# Patient Record
Sex: Male | Born: 1955 | Race: White | Hispanic: No | Marital: Married | Smoking: Current some day smoker
Health system: Southern US, Community
[De-identification: ages and names within clinical notes are randomized; demographics above are authoritative.]

## PROBLEM LIST (undated history)

## (undated) DIAGNOSIS — F410 Panic disorder [episodic paroxysmal anxiety] without agoraphobia: Secondary | ICD-10-CM

## (undated) DIAGNOSIS — K219 Gastro-esophageal reflux disease without esophagitis: Secondary | ICD-10-CM

## (undated) DIAGNOSIS — T148XXA Other injury of unspecified body region, initial encounter: Secondary | ICD-10-CM

## (undated) DIAGNOSIS — I10 Essential (primary) hypertension: Secondary | ICD-10-CM

## (undated) DIAGNOSIS — R319 Hematuria, unspecified: Secondary | ICD-10-CM

## (undated) DIAGNOSIS — R0789 Other chest pain: Secondary | ICD-10-CM

## (undated) DIAGNOSIS — I4891 Unspecified atrial fibrillation: Secondary | ICD-10-CM

## (undated) HISTORY — PX: TONSILECTOMY, ADENOIDECTOMY, BILATERAL MYRINGOTOMY AND TUBES: SHX2538

## (undated) HISTORY — DX: Unspecified atrial fibrillation: I48.91

## (undated) HISTORY — DX: Other chest pain: R07.89

## (undated) HISTORY — DX: Gastro-esophageal reflux disease without esophagitis: K21.9

## (undated) HISTORY — DX: Essential (primary) hypertension: I10

---

## 1978-07-13 HISTORY — PX: NOSE SURGERY: SHX723

## 2005-06-05 ENCOUNTER — Ambulatory Visit: Payer: Self-pay | Admitting: Internal Medicine

## 2005-06-18 ENCOUNTER — Ambulatory Visit: Payer: Self-pay | Admitting: Internal Medicine

## 2006-08-17 ENCOUNTER — Ambulatory Visit: Payer: Self-pay | Admitting: Internal Medicine

## 2007-01-21 ENCOUNTER — Ambulatory Visit (HOSPITAL_COMMUNITY): Admission: RE | Admit: 2007-01-21 | Discharge: 2007-01-21 | Payer: Self-pay | Admitting: Internal Medicine

## 2007-01-21 ENCOUNTER — Ambulatory Visit: Payer: Self-pay | Admitting: Internal Medicine

## 2007-02-28 ENCOUNTER — Inpatient Hospital Stay (HOSPITAL_COMMUNITY): Admission: EM | Admit: 2007-02-28 | Discharge: 2007-03-01 | Payer: Self-pay | Admitting: Emergency Medicine

## 2007-02-28 ENCOUNTER — Ambulatory Visit: Payer: Self-pay | Admitting: Internal Medicine

## 2007-03-01 ENCOUNTER — Encounter: Payer: Self-pay | Admitting: Internal Medicine

## 2007-03-11 ENCOUNTER — Encounter: Payer: Self-pay | Admitting: Internal Medicine

## 2007-03-11 ENCOUNTER — Ambulatory Visit: Payer: Self-pay

## 2007-03-16 DIAGNOSIS — I10 Essential (primary) hypertension: Secondary | ICD-10-CM | POA: Insufficient documentation

## 2007-03-22 ENCOUNTER — Ambulatory Visit: Payer: Self-pay | Admitting: Internal Medicine

## 2007-07-13 ENCOUNTER — Telehealth: Payer: Self-pay | Admitting: Internal Medicine

## 2007-09-13 ENCOUNTER — Encounter: Payer: Self-pay | Admitting: Internal Medicine

## 2008-06-01 ENCOUNTER — Telehealth: Payer: Self-pay | Admitting: Internal Medicine

## 2008-06-08 ENCOUNTER — Telehealth: Payer: Self-pay | Admitting: Internal Medicine

## 2008-06-11 ENCOUNTER — Telehealth: Payer: Self-pay | Admitting: Internal Medicine

## 2008-06-18 ENCOUNTER — Ambulatory Visit: Payer: Self-pay | Admitting: Internal Medicine

## 2008-06-28 ENCOUNTER — Telehealth: Payer: Self-pay | Admitting: Internal Medicine

## 2008-07-13 HISTORY — PX: OTHER SURGICAL HISTORY: SHX169

## 2008-11-13 ENCOUNTER — Encounter: Payer: Self-pay | Admitting: Internal Medicine

## 2009-08-08 ENCOUNTER — Telehealth: Payer: Self-pay | Admitting: Internal Medicine

## 2009-12-23 ENCOUNTER — Telehealth: Payer: Self-pay | Admitting: Internal Medicine

## 2009-12-23 ENCOUNTER — Telehealth: Payer: Self-pay

## 2010-06-11 ENCOUNTER — Ambulatory Visit: Payer: Self-pay | Admitting: Family Medicine

## 2010-06-20 ENCOUNTER — Ambulatory Visit: Payer: Self-pay | Admitting: Internal Medicine

## 2010-06-27 ENCOUNTER — Ambulatory Visit: Payer: Self-pay | Admitting: Internal Medicine

## 2010-08-12 NOTE — Progress Notes (Signed)
Summary: refill allegra  Phone Note Refill Request Message from:  Fax from Pharmacy on December 23, 2009 12:01 PM  Refills Requested: Medication #1:  ALLEGRA 180 MG  TABS once daily as needed [facility] - 90 day   Method Requested: Fax to Local Pharmacy Initial call taken by: Duard Brady LPN,  December 23, 2009 12:02 PM  Follow-up for Phone Call        spoke with Inetta Fermo - spouse - will need to be seen - last seen 06/2008 - can call out a 30day but no more refills until senn. Verbalized understanding - will call back to let me know mail order or local pharm for rx Follow-up by: Duard Brady LPN,  December 23, 2009 12:08 PM

## 2010-08-12 NOTE — Assessment & Plan Note (Signed)
Summary: elevated bp//ccm   Vital Signs:  Patient profile:   54 year old male O2 Sat:      97 % Temp:     97.8 degrees F Pulse rate:   78 / minute BP sitting:   170 / 104  Vitals Entered By: Pura Spice, RN (June 11, 2010 4:21 PM) CC: bp issues and headaches   History of Present Illness: Here for HAs and elevated BP. He has a hx of HTN, and he has been taking his medications regularly. However he has not been seen here in about 2 years, and he had not checked his BP in that whole time. Over the past few weeks he has developed some chronic dull HAs that were unusual for him. No SOB or chest pain. He checked his BP at the pharmacy this am, and it was quite high, so he came in today.   Allergies (verified): No Known Drug Allergies  Past History:  Past Medical History: Reviewed history from 03/16/2007 and no changes required. Hypertension  Review of Systems  The patient denies anorexia, fever, weight loss, weight gain, vision loss, decreased hearing, hoarseness, chest pain, syncope, dyspnea on exertion, peripheral edema, prolonged cough, hemoptysis, abdominal pain, melena, hematochezia, severe indigestion/heartburn, hematuria, incontinence, genital sores, muscle weakness, suspicious skin lesions, transient blindness, difficulty walking, depression, unusual weight change, abnormal bleeding, enlarged lymph nodes, angioedema, breast masses, and testicular masses.    Physical Exam  General:  Well-developed,well-nourished,in no acute distress; alert,appropriate and cooperative throughout examination Head:  Normocephalic and atraumatic without obvious abnormalities. No apparent alopecia or balding. Eyes:  No corneal or conjunctival inflammation noted. EOMI. Perrla. Funduscopic exam benign, without hemorrhages, exudates or papilledema. Vision grossly normal. Ears:  External ear exam shows no significant lesions or deformities.  Otoscopic examination reveals clear canals, tympanic  membranes are intact bilaterally without bulging, retraction, inflammation or discharge. Hearing is grossly normal bilaterally. Nose:  External nasal examination shows no deformity or inflammation. Nasal mucosa are pink and moist without lesions or exudates. Mouth:  Oral mucosa and oropharynx without lesions or exudates.  Teeth in good repair. Neck:  No deformities, masses, or tenderness noted. Lungs:  Normal respiratory effort, chest expands symmetrically. Lungs are clear to auscultation, no crackles or wheezes. Heart:  Normal rate and regular rhythm. S1 and S2 normal without gallop, murmur, click, rub or other extra sounds.   Impression & Recommendations:  Problem # 1:  HYPERTENSION (ICD-401.9)  The following medications were removed from the medication list:    Altace 10 Mg Caps (Ramipril) ..... Once daily His updated medication list for this problem includes:    Losartan Potassium-hctz 100-25 Mg Tabs (Losartan potassium-hctz) ..... Once daily    Amlodipine Besylate 5 Mg Tabs (Amlodipine besylate) ..... Once daily  Complete Medication List: 1)  Allegra 180 Mg Tabs (Fexofenadine hcl) .... Once daily as needed 2)  Trazodone Hcl 150 Mg Tabs (Trazodone hcl) .Marland Kitchen.. 1 at bedtime 3)  Losartan Potassium-hctz 100-25 Mg Tabs (Losartan potassium-hctz) .... Once daily 4)  Amlodipine Besylate 5 Mg Tabs (Amlodipine besylate) .... Once daily  Patient Instructions: 1)  We will change his meds as above, and I asked him to follow up with Dr. Amador Cunas in 2-3 weeks. He will set up a cpx with labs also. Prescriptions: TRAZODONE HCL 150 MG  TABS (TRAZODONE HCL) 1 at bedtime  #30 x 2   Entered and Authorized by:   Nelwyn Salisbury MD   Signed by:   Nelwyn Salisbury MD  on 06/11/2010   Method used:   Electronically to        [facility] (retail)       [address]       [place], [place]  [address]       Ph: [phone]       Fax: [phone]   RxID:    [id number] AMLODIPINE BESYLATE 5 MG TABS (AMLODIPINE BESYLATE) once daily  #30 x 2   Entered and Authorized by:   Nelwyn Salisbury MD   Signed by:   Nelwyn Salisbury MD on 06/11/2010   Method used:   Electronically to        [facility] (retail)       [address]       [place], [place]  [address]       Ph: [phone]       Fax: [phone]   RxID:   [id number] LOSARTAN POTASSIUM-HCTZ 100-25 MG TABS (LOSARTAN POTASSIUM-HCTZ) once daily  #30 x 2   Entered and Authorized by:   Nelwyn Salisbury MD   Signed by:   Nelwyn Salisbury MD on 06/11/2010   Method used:   Electronically to        [facility] (retail)       [address]       [place], [place]  [address]       Ph: [phone]       Fax: [phone]   RxID:   [id number]    Orders Added: 1)  Est. Patient Level IV [[address]]

## 2010-08-12 NOTE — Progress Notes (Signed)
Summary: REFILL  Phone Note Refill Request Message from:  Fax from Pharmacy  Refills Requested: Medication #1:  ALTACE 10 MG  CAPS once daily   Brand Name Necessary? No [facility] FAX---[phone]  Initial call taken by: Warnell Forester,  August 08, 2009 10:14 AM    Prescriptions: ALTACE 10 MG  CAPS (RAMIPRIL) once daily  #90 x 3   Entered by:   Raechel Ache, RN   Authorized by:   Gordy Savers  MD   Signed by:   Raechel Ache, RN on 08/08/2009   Method used:   Printed then faxed to ...       [facility])       [address] [address]       [place], [place]  [address]       Ph: [phone]       Fax: [phone]   RxID:   [id number]

## 2010-08-12 NOTE — Progress Notes (Signed)
Summary: [facility]- allergra  Phone Note Call from Patient   Caller: Spouse Call For: Gordy Savers  MD Summary of Call: kim please call [facility] [place] [phone] Initial call taken by: Heron Sabins,  December 23, 2009 1:16 PM    Prescriptions: ALLEGRA 180 MG  TABS (FEXOFENADINE HCL) once daily as needed  #30 x 0   Entered by:   Duard Brady LPN   Authorized by:   Gordy Savers  MD   Signed by:   Duard Brady LPN on 45/40/9811   Method used:   Faxed to ...       [facility] (retail)       [address]       [place], [place]  [address]       Ph: [phone]       Fax: [phone]   RxID:   [id number]

## 2010-09-06 ENCOUNTER — Other Ambulatory Visit: Payer: Self-pay | Admitting: Family Medicine

## 2010-09-19 ENCOUNTER — Other Ambulatory Visit: Payer: BC Managed Care – PPO | Admitting: Internal Medicine

## 2010-09-19 DIAGNOSIS — Z0000 Encounter for general adult medical examination without abnormal findings: Secondary | ICD-10-CM

## 2010-09-19 LAB — POCT URINALYSIS DIPSTICK
Bilirubin, UA: NEGATIVE
Glucose, UA: NEGATIVE
Leukocytes, UA: NEGATIVE
Nitrite, UA: NEGATIVE
pH, UA: 6

## 2010-09-19 LAB — BASIC METABOLIC PANEL
Chloride: 99 mEq/L (ref 96–112)
GFR: 68.06 mL/min (ref >60.00)
Glucose, Bld: 101 mg/dL — ABNORMAL HIGH (ref 70–99)
Potassium: 3.9 mEq/L (ref 3.5–5.1)
Sodium: 139 mEq/L (ref 135–145)

## 2010-09-19 LAB — LIPID PANEL
LDL Cholesterol: 105 mg/dL — ABNORMAL HIGH (ref 0–99)
Total CHOL/HDL Ratio: 5
VLDL: 32.6 mg/dL (ref 0.0–40.0)

## 2010-09-19 LAB — CBC WITH DIFFERENTIAL/PLATELET
Eosinophils Relative: 2.4 % (ref 0.0–5.0)
HCT: 42.7 % (ref 39.0–52.0)
Hemoglobin: 15 g/dL (ref 13.0–17.0)
Lymphs Abs: 1.3 10*3/uL (ref 0.7–4.0)
MCV: 93.1 fl (ref 78.0–100.0)
Monocytes Absolute: 0.6 10*3/uL (ref 0.1–1.0)
Monocytes Relative: 10.2 % (ref 3.0–12.0)
Neutro Abs: 3.7 10*3/uL (ref 1.4–7.7)
WBC: 5.7 10*3/uL (ref 4.5–10.5)

## 2010-09-19 LAB — PSA: PSA: 0.55 ng/mL (ref 0.10–4.00)

## 2010-09-19 LAB — HEPATIC FUNCTION PANEL
Bilirubin, Direct: 0.1 mg/dL (ref 0.0–0.3)
Total Bilirubin: 0.7 mg/dL (ref 0.3–1.2)

## 2010-09-25 ENCOUNTER — Encounter: Payer: Self-pay | Admitting: Internal Medicine

## 2010-09-26 ENCOUNTER — Ambulatory Visit (INDEPENDENT_AMBULATORY_CARE_PROVIDER_SITE_OTHER): Payer: BC Managed Care – PPO | Admitting: Internal Medicine

## 2010-09-26 ENCOUNTER — Encounter: Payer: Self-pay | Admitting: Internal Medicine

## 2010-09-26 VITALS — BP 110/78 | HR 88 | Temp 98.6°F | Resp 18 | Ht 78.0 in | Wt 258.0 lb

## 2010-09-26 DIAGNOSIS — I10 Essential (primary) hypertension: Secondary | ICD-10-CM

## 2010-09-26 MED ORDER — AMLODIPINE BESYLATE 5 MG PO TABS: 5.0000 mg | ORAL_TABLET | Freq: Every day | ORAL | Status: DC

## 2010-09-26 MED ORDER — LOSARTAN POTASSIUM-HCTZ 100-25 MG PO TABS: 1.0000 | ORAL_TABLET | Freq: Every day | ORAL | Status: DC

## 2010-09-26 NOTE — Progress Notes (Signed)
  Subjective:    Patient ID: Thomas Baldwin, male    DOB: Jan 08, 1956, 55 y.o.   MRN: [id number]  HPI   55 year old patient who is seen today for a wellness exam. He has a history of treated hypertension and does quite well. No concerns or complaints today. He does monitor home blood pressure readings with nice results. He did have a colonoscopy approximately 3 years ago    Review of Systems  Constitutional: Negative for fever, chills, activity change, appetite change and fatigue.  HENT: Negative for hearing loss, ear pain, congestion, rhinorrhea, sneezing, mouth sores, trouble swallowing, neck pain, neck stiffness, dental problem, voice change, sinus pressure and tinnitus.   Eyes: Negative for photophobia, pain, redness and visual disturbance.  Respiratory: Negative for apnea, cough, choking, chest tightness, shortness of breath and wheezing.   Cardiovascular: Negative for chest pain, palpitations and leg swelling.  Gastrointestinal: Negative for nausea, vomiting, abdominal pain, diarrhea, constipation, blood in stool, abdominal distention, anal bleeding and rectal pain.  Genitourinary: Negative for dysuria, urgency, frequency, hematuria, flank pain, decreased urine volume, discharge, penile swelling, scrotal swelling, difficulty urinating, genital sores and testicular pain.  Musculoskeletal: Negative for myalgias, back pain, joint swelling, arthralgias and gait problem.  Skin: Negative for color change, rash and wound.  Neurological: Negative for dizziness, tremors, seizures, syncope, facial asymmetry, speech difficulty, weakness, light-headedness, numbness and headaches.  Hematological: Negative for adenopathy. Does not bruise/bleed easily.  Psychiatric/Behavioral: Negative for suicidal ideas, hallucinations, behavioral problems, confusion, sleep disturbance, self-injury, dysphoric mood, decreased concentration and agitation. The patient is not nervous/anxious.        Objective:   Physical  Exam  Constitutional: He appears well-developed and well-nourished.  HENT:  Head: Normocephalic and atraumatic.  Right Ear: External ear normal.  Left Ear: External ear normal.  Nose: Nose normal.  Mouth/Throat: Oropharynx is clear and moist.  Eyes: Conjunctivae and EOM are normal. Pupils are equal, round, and reactive to light. No scleral icterus.  Neck: Normal range of motion. Neck supple. No JVD present. No thyromegaly present.  Cardiovascular: Regular rhythm, normal heart sounds and intact distal pulses.  Exam reveals no gallop and no friction rub.   No murmur heard. Pulmonary/Chest: Effort normal and breath sounds normal. He exhibits no tenderness.  Abdominal: Soft. Bowel sounds are normal. He exhibits no distension and no mass. There is no tenderness.  Genitourinary: Prostate normal and penis normal.  Musculoskeletal: Normal range of motion. He exhibits no edema and no tenderness.  Lymphadenopathy:    He has no cervical adenopathy.  Neurological: He is alert. He has normal reflexes. No cranial nerve deficit. Coordination normal.  Skin: Skin is warm and dry. No rash noted.  Psychiatric: He has a normal mood and affect. His behavior is normal.          Assessment & Plan:   preventive health examination.  Hypertension well controlled

## 2010-09-26 NOTE — Patient Instructions (Signed)
It is important that you exercise regularly, at least 20 minutes 3 to 4 times per week.  If you develop chest pain or shortness of breath seek  medical attention.  Please check your blood pressure on a regular basis.  If it is consistently greater than 150/90, please make an office appointment.  Return in one year for follow-up\ 

## 2010-10-16 ENCOUNTER — Other Ambulatory Visit: Payer: Self-pay | Admitting: *Deleted

## 2010-10-16 MED ORDER — TRAZODONE HCL 150 MG PO TABS: 150.0000 mg | ORAL_TABLET | Freq: Every day | ORAL | Status: DC

## 2010-11-25 NOTE — Op Note (Signed)
NAMEMAJD, TISSUE                 ACCOUNT NO.:  [id number]   MEDICAL RECORD NO.:  [id number]          PATIENT TYPE:  AMB   LOCATION:  DAY                           FACILITY:  [facility]   PHYSICIAN:  R. Roetta Sessions, M.D. DATE OF BIRTH:  03/17/1956   DATE OF PROCEDURE:  01/21/2007  DATE OF DISCHARGE:                               OPERATIVE REPORT   PROCEDURE:  Screening colonoscopy.   INDICATIONS FOR PROCEDURE:  Patient is 55 year old Caucasian male with  no lower GI tract symptoms sent over courtesy Dr. Amador Cunas, [facility] [place] for colorectal cancer screening via  colonoscopy.  Mr. Brining has never his lower GI tract imaged.  There was  no family history of colorectal neoplasia.  Colonoscopy is now being  done as a screening maneuver.  This approach has discussed with the  patient at length.  Potential risks, benefits and alternatives have been  reviewed, questions answered, he is agreeable.  Please see documentation  in the medical record.   PROCEDURE NOTE:  O2 saturation, blood pressure, pulse respirations were  monitored throughout the entire procedure.  Conscious sedation Versed 3  mg IV Demerol 75 mg IV in divided doses.   INSTRUMENT:  Pentax video chip system.   FINDINGS:  Digital rectal exam revealed no abnormalities.   ENDOSCOPIC FINDINGS:  The prep was adequate.  Colon:  Colonic mucosa was surveyed from rectosigmoid junction to the  left, transverse, right colon to the appendiceal orifice, ileocecal  valve and cecum.  These structures well seen and photographed for the  record.  From this level scope was slowly cautiously withdrawn.  All  previously mentioned mucosal surfaces were again seen.  The colonic  mucosa appeared normal.  Scope was pulled down in the rectum.  Thorough  examination of rectal mucosa including retroflex view of the anal verge  demonstrated no abnormalities as well.  The patient tolerated the  procedure well was reacted in  endoscopy.   IMPRESSION:  1. Normal rectum.  2. Normal colon.   RECOMMENDATIONS:  Consider repeat screening colonoscopy 10 years.      Jonathon Bellows, M.D.  Electronically Signed     RMR/MEDQ  D:  01/21/2007  T:  01/22/2007  Job:  [id number]   cc:   Gordy Savers, MD  [address] [place]  [place] [address]

## 2010-11-25 NOTE — Discharge Summary (Signed)
Thomas Baldwin, BAYLOCK                 ACCOUNT NO.:  [id number]   MEDICAL RECORD NO.:  [id number]          PATIENT TYPE:  INP   LOCATION:  3701                         FACILITY:  [facility]   PHYSICIAN:  Valerie A. Felicity Coyer, MDDATE OF BIRTH:  16-Nov-1955   DATE OF ADMISSION:  02/28/2007  DATE OF DISCHARGE:  03/01/2007                               DISCHARGE SUMMARY   DISCHARGE DIAGNOSES:  1. Atypical chest pain  2. History of hypertension.  3. Question of gastroesophageal reflux disease.   HISTORY OF PRESENT ILLNESS:  Thomas Baldwin is a 55 year old male who was  admitted on February 28, 2007, with Chief Complaint of substernal chest  pain.  He has a known history of hypertension. Chest pain began during a  phone conversation at work.  It was noted to be midsternal with chest  heaviness, felt like gas but worse than ever before. Symptoms improved  with nitroglycerin.  He was admitted, and cardiac enzymes were cycled.  Cardiac enzymes remained normal. Fasting lipid profile noted a total  cholesterol of 126 and an LDL of 64. HDL was low with a value of 24.  Chest x-ray performed on admission showed bibasilar atelectasis.  A D-  dimer was normal with a value of less than 0.22.   At this time we have asked [facility] to arrange an outpatient  stress test this week.  Plan to continue empiric Protonix and discharge  the patient to home.  He is instructed, however, to return to the ER  should he develop recurrent chest pain or shortness of breath.   DISCHARGE MEDICATIONS:  1. Altace 10 mg p.o. daily.  2. Protonix 40 mg daily.  3. Aspirin 325 mg p.o. daily.  4. Allegra 60 mg p.o. daily as needed.  5. Trazodone 25 mg p.o. once daily as needed.   DISPOSITION:  Plan to discharge the patient to home.   PERTINENT LABORATORY DATA:  At time of discharge, cardiac enzymes  negative x4.   FOLLOWUP:  The patient is scheduled to follow up with Dr. Eleonore Chiquito on September 9 at 3:45  p.m.      Thomas Craze, NP      Raenette Rover. Felicity Coyer, MD  Electronically Signed    MO/MEDQ  D:  03/01/2007  T:  03/01/2007  Job:  [id number]   cc:   Gordy Savers, MD

## 2011-04-24 LAB — CARDIAC PANEL(CRET KIN+CKTOT+MB+TROPI)
CK, MB: 1.2
Relative Index: 1.3
Relative Index: INVALID
Total CK: 117
Total CK: 95
Troponin I: 0.02

## 2011-04-24 LAB — APTT: aPTT: 29

## 2011-04-24 LAB — CBC
Hemoglobin: 14.5
RBC: 4.58
RDW: 12.3
WBC: 5.6

## 2011-04-24 LAB — I-STAT 8, (EC8 V) (CONVERTED LAB)
Bicarbonate: 28.5 — ABNORMAL HIGH
Glucose, Bld: 111 — ABNORMAL HIGH
Sodium: 139
TCO2: 30
pH, Ven: 7.365 — ABNORMAL HIGH

## 2011-04-24 LAB — DIFFERENTIAL
Basophils Absolute: 0
Lymphocytes Relative: 16
Lymphs Abs: 0.9
Monocytes Absolute: 0.6
Neutro Abs: 3.8

## 2011-04-24 LAB — POCT CARDIAC MARKERS
CKMB, poc: <1 — ABNORMAL LOW
Myoglobin, poc: 77.3

## 2011-04-24 LAB — POCT I-STAT CREATININE: Operator id: 279831

## 2011-04-24 LAB — CK TOTAL AND CKMB (NOT AT ARMC)
CK, MB: 2
Total CK: 154

## 2011-04-24 LAB — LIPID PANEL: Cholesterol: 126

## 2011-05-20 ENCOUNTER — Other Ambulatory Visit: Payer: Self-pay | Admitting: Internal Medicine

## 2011-11-25 ENCOUNTER — Other Ambulatory Visit: Payer: Self-pay | Admitting: Internal Medicine

## 2011-12-29 ENCOUNTER — Other Ambulatory Visit (INDEPENDENT_AMBULATORY_CARE_PROVIDER_SITE_OTHER): Payer: BC Managed Care – PPO

## 2011-12-29 DIAGNOSIS — Z0000 Encounter for general adult medical examination without abnormal findings: Secondary | ICD-10-CM

## 2011-12-29 LAB — HEPATIC FUNCTION PANEL
AST: 25 U/L (ref 0–37)
Albumin: 4.3 g/dL (ref 3.5–5.2)
Alkaline Phosphatase: 51 U/L (ref 39–117)
Total Protein: 7.4 g/dL (ref 6.0–8.3)

## 2011-12-29 LAB — LIPID PANEL
Cholesterol: 162 mg/dL (ref 0–200)
HDL: 41.5 mg/dL (ref >39.00)
VLDL: 22.6 mg/dL (ref 0.0–40.0)

## 2011-12-29 LAB — CBC WITH DIFFERENTIAL/PLATELET
Basophils Absolute: 0 10*3/uL (ref 0.0–0.1)
Eosinophils Relative: 2 % (ref 0.0–5.0)
Hemoglobin: 14.8 g/dL (ref 13.0–17.0)
Lymphocytes Relative: 18.7 % (ref 12.0–46.0)
Monocytes Relative: 10.8 % (ref 3.0–12.0)
Neutro Abs: 3.6 10*3/uL (ref 1.4–7.7)
Platelets: 179 10*3/uL (ref 150.0–400.0)
RDW: 12.8 % (ref 11.5–14.6)
WBC: 5.3 10*3/uL (ref 4.5–10.5)

## 2011-12-29 LAB — BASIC METABOLIC PANEL
Calcium: 9.2 mg/dL (ref 8.4–10.5)
GFR: 66.45 mL/min (ref >60.00)
Glucose, Bld: 117 mg/dL — ABNORMAL HIGH (ref 70–99)
Sodium: 143 mEq/L (ref 135–145)

## 2012-01-08 ENCOUNTER — Encounter: Payer: Self-pay | Admitting: Internal Medicine

## 2012-01-08 ENCOUNTER — Ambulatory Visit (INDEPENDENT_AMBULATORY_CARE_PROVIDER_SITE_OTHER): Payer: BC Managed Care – PPO | Admitting: Internal Medicine

## 2012-01-08 VITALS — BP 130/80 | Temp 97.5°F | Ht 77.45 in | Wt 243.0 lb

## 2012-01-08 DIAGNOSIS — Z0000 Encounter for general adult medical examination without abnormal findings: Secondary | ICD-10-CM

## 2012-01-08 DIAGNOSIS — I4891 Unspecified atrial fibrillation: Secondary | ICD-10-CM

## 2012-01-08 DIAGNOSIS — I10 Essential (primary) hypertension: Secondary | ICD-10-CM

## 2012-01-08 MED ORDER — VERAPAMIL HCL ER 240 MG PO CP24: 240.0000 mg | ORAL_CAPSULE | Freq: Every day | ORAL | Status: DC

## 2012-01-08 MED ORDER — DABIGATRAN ETEXILATE MESYLATE 150 MG PO CAPS: 150.0000 mg | ORAL_CAPSULE | Freq: Two times a day (BID) | ORAL | Status: DC

## 2012-01-08 NOTE — Patient Instructions (Addendum)
Limit your sodium (Salt) intake  Cardiology followup as discussed  Atrial Fibrillation Your caregiver has diagnosed you with atrial fibrillation (AFib). The heart normally beats very regularly; AFib is a type of irregular heartbeat. The heart rate may be faster or slower than normal. This can prevent your heart from pumping as well as it should. AFib can be constant (chronic) or intermittent (paroxysmal). CAUSES   Atrial fibrillation may be caused by:  Heart disease, including heart attack, coronary artery disease, heart failure, diseases of the heart valves, and others.   Blood clot in the lungs (pulmonary embolism).   Pneumonia or other infections.   Chronic lung disease.   Thyroid disease.   Toxins. These include alcohol, some medications (such as decongestant medications or diet pills), and caffeine.  In some people, no cause for AFib can be found. This is referred to as Lone Atrial Fibrillation. SYMPTOMS    Palpitations or a fluttering in your chest.   A vague sense of chest discomfort.   Shortness of breath.   Sudden onset of lightheadedness or weakness.  Sometimes, the first sign of AFib can be a complication of the condition. This could be a stroke or heart failure. DIAGNOSIS   Your description of your condition may make your caregiver suspicious of atrial fibrillation. Your caregiver will examine your pulse to determine if fibrillation is present. An EKG (electrocardiogram) will confirm the diagnosis. Further testing may help determine what caused you to have atrial fibrillation. This may include chest x-ray, echocardiogram, blood tests, or CT scans. PREVENTION   If you have previously had atrial fibrillation, your caregiver may advise you to avoid substances known to cause the condition (such as stimulant medications, and possibly caffeine or alcohol). You may be advised to use medications to prevent recurrence. Proper treatment of any underlying condition is important to  help prevent recurrence. PROGNOSIS   Atrial fibrillation does tend to become a chronic condition over time. It can cause significant complications (see below). Atrial fibrillation is not usually immediately life-threatening, but it can shorten your life expectancy. This seems to be worse in women. If you have lone atrial fibrillation and are under 61 years old, the risk of complications is very low, and life expectancy is not shortened. RISKS AND COMPLICATIONS   Complications of atrial fibrillation can include stroke, chest pain, and heart failure. Your caregiver will recommend treatments for the atrial fibrillation, as well as for any underlying conditions, to help minimize risk of complications. TREATMENT   Treatment for AFib is divided into several categories:  Treatment of any underlying condition.   Converting you out of AFib into a regular (sinus) rhythm.   Controlling rapid heart rate.   Prevention of blood clots and stroke.  Medications and procedures are available to convert your atrial fibrillation to sinus rhythm. However, recent studies have shown that this may not offer you any advantage, and cardiac experts are continuing research and debate on this topic. More important is controlling your rapid heartbeat. The rapid heartbeat causes more symptoms, and places strain on your heart. Your caregiver will advise you on the use of medications that can control your heart rate. Atrial fibrillation is a strong stroke risk. You can lessen this risk by taking blood thinning medications such as Coumadin (warfarin), or sometimes aspirin. These medications need close monitoring by your caregiver. Over-medication can cause bleeding. Too little medication may not protect against stroke. HOME CARE INSTRUCTIONS    If your caregiver prescribed medicine to make your heartbeat  more normally, take as directed.   If blood thinners were prescribed by your caregiver, take EXACTLY as directed.   Perform  blood tests EXACTLY as directed.   Quit smoking. Smoking increases your cardiac and lung (pulmonary) risks.   DO NOT drink alcohol.   DO NOT drink caffeinated drinks (e.g. coffee, soda, chocolate, and leaf teas). You may drink decaffeinated coffee, soda or tea.   If you are overweight, you should choose a reduced calorie diet to lose weight. Please see a registered dietitian if you need more information about healthy weight loss. DO NOT USE DIET PILLS as they may aggravate heart problems.   If you have other heart problems that are causing AFib, you may need to eat a low salt, fat, and cholesterol diet. Your caregiver will tell you if this is necessary.   Exercise every day to improve your physical fitness. Stay active unless advised otherwise.   If your caregiver has given you a follow-up appointment, it is very important to keep that appointment. Not keeping the appointment could result in heart failure or stroke. If there is any problem keeping the appointment, you must call back to this facility for assistance.  SEEK MEDICAL CARE IF:  You notice a change in the rate, rhythm or strength of your heartbeat.   You develop an infection or any other change in your overall health status.  SEEK IMMEDIATE MEDICAL CARE IF:    You develop chest pain, abdominal pain, sweating, weakness or feel sick to your stomach (nausea).   You develop shortness of breath.   You develop swollen feet and ankles.   You develop dizziness, numbness, or weakness of your face or limbs, or any change in vision or speech.  MAKE SURE YOU:    Understand these instructions.   Will watch your condition.   Will get help right away if you are not doing well or get worse.  Document Released: 06/29/2005 Document Revised: 06/18/2011 Document Reviewed: 02/01/2008 [facility] Patient Information 2012 [place], [place].

## 2012-01-08 NOTE — Progress Notes (Signed)
Subjective:    Patient ID: Thomas Baldwin, male    DOB: 05-29-1956, 56 y.o.   MRN: [id number]  HPI 56 year old patient who is seen today for a wellness exam. He has an approximate seven-year history of treated hypertension and does quite well. No concerns or complaints today. He was hospitalized in 2007 for chest pain; he subsequently had a nuclear medicine stress test that was unremarkable. EKG today reveals atrial fibrillation with rapid ventricular response. He is asymptomatic and denies any palpitations  Past medical history is unremarkable except for hypertension. No hospitalizations except for the admission in 2007 for chest pain. Did have a screening colonoscopy 4 years ago Social history nonsmoker smokes a few cigars annually married one 88 year old daughter Family history father age 41 history of diabetes coronary artery disease status post pacemaker has cancer unclear type. Mother died of complications of lupus in her 49s. One brother with sarcoidosis. One sister is well    Review of Systems  Constitutional: Negative for fever, chills, activity change, appetite change and fatigue.  HENT: Negative for hearing loss, ear pain, congestion, rhinorrhea, sneezing, mouth sores, trouble swallowing, neck pain, neck stiffness, dental problem, voice change, sinus pressure and tinnitus.   Eyes: Negative for photophobia, pain, redness and visual disturbance.  Respiratory: Negative for apnea, cough, choking, chest tightness, shortness of breath and wheezing.   Cardiovascular: Negative for chest pain, palpitations and leg swelling.  Gastrointestinal: Negative for nausea, vomiting, abdominal pain, diarrhea, constipation, blood in stool, abdominal distention, anal bleeding and rectal pain.  Genitourinary: Negative for dysuria, urgency, frequency, hematuria, flank pain, decreased urine volume, discharge, penile swelling, scrotal swelling, difficulty urinating, genital sores and testicular pain.    Musculoskeletal: Negative for myalgias, back pain, joint swelling, arthralgias and gait problem.  Skin: Negative for color change, rash and wound.  Neurological: Negative for dizziness, tremors, seizures, syncope, facial asymmetry, speech difficulty, weakness, light-headedness, numbness and headaches.  Hematological: Negative for adenopathy. Does not bruise/bleed easily.  Psychiatric/Behavioral: Negative for suicidal ideas, hallucinations, behavioral problems, confusion, disturbed wake/sleep cycle, self-injury, dysphoric mood, decreased concentration and agitation. The patient is not nervous/anxious.        Objective:   Physical Exam  Constitutional: He appears well-developed and well-nourished.  HENT:  Head: Normocephalic and atraumatic.  Right Ear: External ear normal.  Left Ear: External ear normal.  Nose: Nose normal.  Mouth/Throat: Oropharynx is clear and moist.  Eyes: Conjunctivae and EOM are normal. Pupils are equal, round, and reactive to light. No scleral icterus.  Neck: Normal range of motion. Neck supple. No JVD present. No thyromegaly present.  Cardiovascular: Normal heart sounds and intact distal pulses.  Exam reveals no gallop and no friction rub.   No murmur heard.      Irregular rhythm with a rate of 120  Pulmonary/Chest: Effort normal and breath sounds normal. He exhibits no tenderness.  Abdominal: Soft. Bowel sounds are normal. He exhibits no distension and no mass. There is no tenderness.  Genitourinary: Prostate normal and penis normal.  Musculoskeletal: Normal range of motion. He exhibits no edema and no tenderness.  Lymphadenopathy:    He has no cervical adenopathy.  Neurological: He is alert. He has normal reflexes. No cranial nerve deficit. Coordination normal.  Skin: Skin is warm and dry. No rash noted.  Psychiatric: He has a normal mood and affect. His behavior is normal.          Assessment & Plan:   Preventive health examination Atrial   fibrillation with rapid ventricular response.  We'll discontinue amlodipine and placed on verapamil for rate control. We'll place on Pradaxa  150 mg twice daily. We'll schedule a 2-D echocardiogram and a cardiology appointment. Hypertension well controlled

## 2012-01-11 ENCOUNTER — Other Ambulatory Visit: Payer: Self-pay | Admitting: Internal Medicine

## 2012-01-11 DIAGNOSIS — I10 Essential (primary) hypertension: Secondary | ICD-10-CM

## 2012-01-11 NOTE — Telephone Encounter (Signed)
Caller: Thomas Baldwin/Patient; PCP: Eleonore Chiquito; CB#: [phone]; ; ; Call regarding RX not at Pharmacy; Pt was seen on 6-28, PT was under the impression the Losartan-HCTZ and Trazodone would be refilled.  Pt uses [facility] [place], Pt needs 3 mths supply.

## 2012-01-12 MED ORDER — TRAZODONE HCL 150 MG PO TABS: 150.0000 mg | ORAL_TABLET | Freq: Every day | ORAL | Status: DC

## 2012-01-12 MED ORDER — LOSARTAN POTASSIUM-HCTZ 100-25 MG PO TABS: 1.0000 | ORAL_TABLET | Freq: Every day | ORAL | Status: DC

## 2012-01-12 NOTE — Telephone Encounter (Signed)
Trazodone faxed - losartan escribed

## 2012-01-13 ENCOUNTER — Ambulatory Visit (HOSPITAL_COMMUNITY): Payer: BC Managed Care – PPO | Attending: Cardiology | Admitting: Radiology

## 2012-01-13 DIAGNOSIS — I10 Essential (primary) hypertension: Secondary | ICD-10-CM | POA: Insufficient documentation

## 2012-01-13 DIAGNOSIS — I517 Cardiomegaly: Secondary | ICD-10-CM | POA: Insufficient documentation

## 2012-01-13 DIAGNOSIS — I4891 Unspecified atrial fibrillation: Secondary | ICD-10-CM

## 2012-01-13 NOTE — Progress Notes (Signed)
Echocardiogram performed.  

## 2012-01-15 ENCOUNTER — Ambulatory Visit: Payer: BC Managed Care – PPO | Admitting: Internal Medicine

## 2012-01-15 NOTE — Progress Notes (Signed)
Quick Note:  Spoke with pt- informed test normal - keep appt with cardiologist next week ______

## 2012-01-19 ENCOUNTER — Encounter: Payer: Self-pay | Admitting: *Deleted

## 2012-01-20 ENCOUNTER — Encounter: Payer: Self-pay | Admitting: Cardiovascular Disease

## 2012-01-20 ENCOUNTER — Ambulatory Visit (INDEPENDENT_AMBULATORY_CARE_PROVIDER_SITE_OTHER): Payer: BC Managed Care – PPO | Admitting: Cardiovascular Disease

## 2012-01-20 VITALS — BP 143/90 | HR 68 | Ht 77.0 in | Wt 243.8 lb

## 2012-01-20 DIAGNOSIS — I4891 Unspecified atrial fibrillation: Secondary | ICD-10-CM

## 2012-01-20 MED ORDER — METOPROLOL TARTRATE 50 MG PO TABS: 50.0000 mg | ORAL_TABLET | Freq: Two times a day (BID) | ORAL | Status: DC

## 2012-01-20 NOTE — Progress Notes (Signed)
    Berniece Andreas Date of Birth  01-28-1956       [facility] [address], [address]  [address], [address] [place], [place]  [address]   [place], [place]  [address] [phone]     [phone]   Fax  [phone]    Fax [phone]  Problem List: 1. Atrial fibrillation 2. Hypertension  History of Present Illness:  Lynden is a 56 yo who was found to have A-Fib at his regular physical last week with Dr Rogelia Boga, MD . His amlodipine was changed to verapamil and he was started on Pradaxa.  He had an echo that revealed LV EF of around 50%.  Lower limits of normal. + diastolic dysfunction.  He was in sinus rhythm during the echo.  He feels quite well now. He wonders if he really needs to stay on his new medications versus going back to his previous medications.   Current Outpatient Prescriptions on File Prior to Visit  Medication Sig Dispense Refill  . dabigatran (PRADAXA) 150 MG CAPS Take 1 capsule (150 mg total) by mouth every 12 (twelve) hours.  60 capsule  4  . fexofenadine (ALLEGRA) 180 MG tablet Take 180 mg by mouth daily.        Marland Kitchen losartan-hydrochlorothiazide (HYZAAR) 100-25 MG per tablet Take 1 tablet by mouth daily.  90 tablet  6  . verapamil (VERELAN PM) 240 MG 24 hr capsule Take 1 capsule (240 mg total) by mouth at bedtime.  90 capsule  0  . traZODone (DESYREL) 150 MG tablet Take 1 tablet (150 mg total) by mouth at bedtime.  90 tablet  1    No Known Allergies  Past Medical History  Diagnosis Date  . Hypertension   . GERD (gastroesophageal reflux disease)     Questionable  . Atypical chest pain     Past Surgical History  Procedure Date  . Tonsilectomy, adenoidectomy, bilateral myringotomy and tubes     History  Smoking status  . Never Smoker   Smokeless tobacco  . Not on file    History  Alcohol Use  . Yes    Family History  Problem Relation Age of Onset  . Coronary artery disease    . Diabetes     . Hypertension      Reviw of Systems:  Reviewed in the HPI.  All other systems are negative.  Physical Exam: Blood pressure 143/90, pulse 68, height 6\' 5"  (1.956 m), weight 243 lb 12.8 oz (110.587 kg). General: Well developed, well nourished, in no acute distress.  Head: Normocephalic, atraumatic, sclera non-icteric, mucus membranes are moist,   Neck: Supple. Carotids are 2 + without bruits. No JVD  Lungs: Clear bilaterally to auscultation.  Heart: regular rate.  normal  S1 S2. No murmurs, gallops or rubs.  Abdomen: Soft, non-tender, non-distended with normal bowel sounds. No hepatomegaly. No rebound/guarding. No masses.  Msk:  Strength and tone are normal  Extremities: No clubbing or cyanosis. No edema.  Distal pedal pulses are 2+ and equal bilaterally.  Neuro: Alert and oriented X 3. Moves all extremities spontaneously.  Psych:  Responds to questions appropriately with a normal affect.  ECG: 12/21/2011-normal sinus rhythm at 60 beats a minute. No ST or T wave changes.  Assessment / Plan:

## 2012-01-20 NOTE — Assessment & Plan Note (Addendum)
Thomas Baldwin was found to have A-Fib but has clearly converted to NSR.  His CHADS score is 1-2 ( HTN and +/- CHF - EF is mildly depressed) .  He is not a fan of medications and wants to stop as many meds as we can.  At this point I would favor continuing the Pradaxa. I would like to see how much atrial fibrillation he has.  But to discontinue verapamil. It is a bit more expensive than his previous medications. We will increase his metoprolol to 50 mg twice a day which should also help with rate control. In addition the higher dose of metoprolol may actually help with his left ventricular systolic function that is at the lower limits of normal. I see him again in 3 months for office visit and EKG. We'll  consider getting an echocardiogram in approximately 6-12 months.

## 2012-01-20 NOTE — Patient Instructions (Addendum)
Alternatives to verapamil   Diltiazem  CD 240 mg a day  Metoprolol 50 mg BID   Your physician recommends that you schedule a follow-up appointment in: 3 months   Your physician has recommended you make the following change in your medication:   STOP VERAPAMIL DUE TO COST AND METOPROLOL WILL HELP YOUR HEART WORK BETTER. START METOPROLOL 50 MG TWICE DAILY 12 HOURS APART

## 2012-04-27 ENCOUNTER — Ambulatory Visit: Payer: BC Managed Care – PPO | Admitting: Cardiovascular Disease

## 2012-05-03 ENCOUNTER — Telehealth: Payer: Self-pay | Admitting: Internal Medicine

## 2012-05-03 NOTE — Telephone Encounter (Signed)
Caller: [name]/Patient; Patient Name: Thomas Baldwin; PCP: Eleonore Chiquito [facility]); Best Callback Phone Number: [phone] Onset- 05/02/12 pt reports that for 2 days his pulse has been lower than usual. Yesterday pulse was around 52, BP was 148/90 and today around 11am pulse was around 47 and 48, BP-systolic 138-140 and diastolic was 80. He took vitals several times at a pharmacy. Pt took pulse on the phone while talking and it is around 52. He also c/o of a dull headache that he rates at a 4 for two days. He has been using Tylenol without relief. Emergent s/s of Irregular heartbeat and headache protocol r/o. Pt to see provider within 24hrs. Appointment scheduled for tomorrow at 10:15 with Dr. Amador Cunas.

## 2012-05-04 ENCOUNTER — Ambulatory Visit (INDEPENDENT_AMBULATORY_CARE_PROVIDER_SITE_OTHER): Payer: BC Managed Care – PPO | Admitting: Internal Medicine

## 2012-05-04 ENCOUNTER — Encounter: Payer: Self-pay | Admitting: Internal Medicine

## 2012-05-04 VITALS — BP 140/90 | HR 50 | Temp 98.0°F | Wt 250.0 lb

## 2012-05-04 DIAGNOSIS — I4891 Unspecified atrial fibrillation: Secondary | ICD-10-CM

## 2012-05-04 DIAGNOSIS — I10 Essential (primary) hypertension: Secondary | ICD-10-CM

## 2012-05-04 MED ORDER — TRAZODONE HCL 150 MG PO TABS: 150.0000 mg | ORAL_TABLET | Freq: Every day | ORAL | Status: DC

## 2012-05-04 MED ORDER — DILTIAZEM HCL ER 240 MG PO CP24: 240.0000 mg | ORAL_CAPSULE | Freq: Every day | ORAL | Status: DC

## 2012-05-04 MED ORDER — FEXOFENADINE HCL 180 MG PO TABS: 180.0000 mg | ORAL_TABLET | Freq: Every day | ORAL | Status: DC

## 2012-05-04 MED ORDER — LOSARTAN POTASSIUM-HCTZ 100-25 MG PO TABS: 1.0000 | ORAL_TABLET | Freq: Every day | ORAL | Status: DC

## 2012-05-04 NOTE — Patient Instructions (Signed)
Metoprolol 25 mg Thursday Friday and Saturday and then discontinue Start diltiazem 240 mg daily  Limit your sodium (Salt) intake  Please check your blood pressure on a regular basis.  If it is consistently greater than 150/90, please make an office appointment.

## 2012-05-04 NOTE — Progress Notes (Signed)
Subjective:    Patient ID: Thomas Baldwin, male    DOB: 12-24-1955, 56 y.o.   MRN: [id number]  HPI  56 year old patient who has a history of treated hypertension and paroxysmal atrial fibrillation. Medical regimen includes metoprolol 50 mg daily. The patient noted a mild headache earlier in the week so he tracked his blood pressure and noted to have a slow pulse often in the high 40s. Except for his headaches which are mild he has no other symptoms such as fatigue weakness or shortness of breath. The patient has had a normal EKG and a 2-D echocardiogram this past summer. He does have a history of some allergic rhinitis.  Past Medical History  Diagnosis Date  . Hypertension   . GERD (gastroesophageal reflux disease)     Questionable  . Atypical chest pain     History   Social History  . Marital Status: Married    Spouse Name: N/A    Number of Children: N/A  . Years of Education: N/A   Occupational History  . Not on file.   Social History Main Topics  . Smoking status: Never Smoker   . Smokeless tobacco: Not on file  . Alcohol Use: Yes  . Drug Use: Not on file  . Sexually Active: Not on file   Other Topics Concern  . Not on file   Social History Narrative  . No narrative on file    Past Surgical History  Procedure Date  . Tonsilectomy, adenoidectomy, bilateral myringotomy and tubes     Family History  Problem Relation Age of Onset  . Coronary artery disease    . Diabetes    . Hypertension      No Known Allergies  Current Outpatient Prescriptions on File Prior to Visit  Medication Sig Dispense Refill  . fexofenadine (ALLEGRA) 180 MG tablet Take 180 mg by mouth daily.        Marland Kitchen losartan-hydrochlorothiazide (HYZAAR) 100-25 MG per tablet Take 1 tablet by mouth daily.  90 tablet  6  . metoprolol (LOPRESSOR) 50 MG tablet Take 1 tablet (50 mg total) by mouth 2 (two) times daily.  60 tablet  5  . traZODone (DESYREL) 150 MG tablet Take 1 tablet (150 mg total) by mouth  at bedtime.  90 tablet  1    BP 140/90  Pulse 50  Temp 98 F (36.7 C) (Oral)  Wt 250 lb (113.399 kg)       Review of Systems  Constitutional: Negative for fever, chills, appetite change and fatigue.  HENT: Negative for hearing loss, ear pain, congestion, sore throat, trouble swallowing, neck stiffness, dental problem, voice change and tinnitus.   Eyes: Negative for pain, discharge and visual disturbance.  Respiratory: Negative for cough, chest tightness, wheezing and stridor.   Cardiovascular: Negative for chest pain, palpitations and leg swelling.  Gastrointestinal: Negative for nausea, vomiting, abdominal pain, diarrhea, constipation, blood in stool and abdominal distention.  Genitourinary: Negative for urgency, hematuria, flank pain, discharge, difficulty urinating and genital sores.  Musculoskeletal: Negative for myalgias, back pain, joint swelling, arthralgias and gait problem.  Skin: Negative for rash.  Neurological: Positive for headaches. Negative for dizziness, syncope, speech difficulty, weakness and numbness.  Hematological: Negative for adenopathy. Does not bruise/bleed easily.  Psychiatric/Behavioral: Negative for behavioral problems and dysphoric mood. The patient is not nervous/anxious.        Objective:   Physical Exam  Constitutional: He is oriented to person, place, and time. He appears well-developed.  HENT:  Head: Normocephalic.  Right Ear: External ear normal.  Left Ear: External ear normal.  Eyes: Conjunctivae normal and EOM are normal.  Neck: Normal range of motion.  Cardiovascular: Normal rate, regular rhythm, normal heart sounds and intact distal pulses.        Pulse 50 O2 saturation 97%  Pulmonary/Chest: Effort normal and breath sounds normal. No respiratory distress. He has no wheezes. He has no rales. He exhibits no tenderness.  Abdominal: Bowel sounds are normal.  Musculoskeletal: Normal range of motion. He exhibits no edema and no tenderness.    Neurological: He is alert and oriented to person, place, and time.  Psychiatric: He has a normal mood and affect. His behavior is normal.          Assessment & Plan:   Sinus bradycardia aggravated by beta blocker therapy.  An EKG was to be performed to confirm a sinus bradycardia. The patient wishes to defer if possible due to his high double health plan. We'll taper and discontinue metoprolol and substitute diltiazem. He will continue to monitor his pulse Paroxysmal atrial fibrillation Hypertension  blood pressure has been high normal to mildly elevated. We'll substitute diltiazem for metoprolol

## 2012-08-02 ENCOUNTER — Ambulatory Visit (INDEPENDENT_AMBULATORY_CARE_PROVIDER_SITE_OTHER): Payer: BC Managed Care – PPO | Admitting: Internal Medicine

## 2012-08-02 ENCOUNTER — Encounter: Payer: Self-pay | Admitting: Internal Medicine

## 2012-08-02 VITALS — BP 130/86 | HR 66 | Resp 18 | Wt 248.0 lb

## 2012-08-02 DIAGNOSIS — I4891 Unspecified atrial fibrillation: Secondary | ICD-10-CM

## 2012-08-02 DIAGNOSIS — I10 Essential (primary) hypertension: Secondary | ICD-10-CM

## 2012-08-02 MED ORDER — DILTIAZEM HCL ER COATED BEADS 360 MG PO CP24: 360.0000 mg | ORAL_CAPSULE | Freq: Every day | ORAL | Status: DC

## 2012-08-02 MED ORDER — DILTIAZEM HCL ER 240 MG PO CP24: 240.0000 mg | ORAL_CAPSULE | Freq: Every day | ORAL | Status: DC

## 2012-08-02 NOTE — Progress Notes (Signed)
  Subjective:    Patient ID: Thomas Baldwin, male    DOB: 16-Aug-1955, 57 y.o.   MRN: [id number]  HPI  57 year old patient who has a history of treated hypertension as well as paroxysmal atrial fibrillation. The patient is seen today as a work in to 2 blood pressure concerns.  Blood pressure more recently has been trending up. Last fall metoprolol was discontinued due 2 bradycardia with rates in the high 40s. The patient generally feels well.  BP Readings from Last 3 Encounters:  08/02/12 130/86  05/04/12 140/90  01/20/12 143/90    Review of Systems  Constitutional: Negative for fever, chills, appetite change and fatigue.  HENT: Negative for hearing loss, ear pain, congestion, sore throat, trouble swallowing, neck stiffness, dental problem, voice change and tinnitus.   Eyes: Negative for pain, discharge and visual disturbance.  Respiratory: Negative for cough, chest tightness, wheezing and stridor.   Cardiovascular: Negative for chest pain, palpitations and leg swelling.  Gastrointestinal: Negative for nausea, vomiting, abdominal pain, diarrhea, constipation, blood in stool and abdominal distention.  Genitourinary: Negative for urgency, hematuria, flank pain, discharge, difficulty urinating and genital sores.  Musculoskeletal: Negative for myalgias, back pain, joint swelling, arthralgias and gait problem.  Skin: Negative for rash.  Neurological: Negative for dizziness, syncope, speech difficulty, weakness, numbness and headaches.  Hematological: Negative for adenopathy. Does not bruise/bleed easily.  Psychiatric/Behavioral: Negative for behavioral problems and dysphoric mood. The patient is not nervous/anxious.        Objective:   Physical Exam  Constitutional: He appears well-developed and well-nourished.       Blood pressure 140 / 80 the right arm and 146/80 in the left  Cardiovascular: Regular rhythm.           Assessment & Plan:   Hypertension. Blood pressure trending up a  bit following discontinuation of metoprolol last year. We'll increase diltiazem to 360 mg daily and continue losartan hydrochlorothiazide. We'll continue home blood pressure monitor and Paroxysmal atrial fibrillation

## 2012-08-02 NOTE — Patient Instructions (Signed)
Limit your sodium (Salt) intake  Please check your blood pressure on a regular basis.  If it is consistently greater than 150/90, please make an office appointment.   

## 2012-08-03 ENCOUNTER — Ambulatory Visit: Payer: BC Managed Care – PPO | Admitting: Internal Medicine

## 2012-12-09 ENCOUNTER — Telehealth: Payer: Self-pay | Admitting: Internal Medicine

## 2012-12-09 MED ORDER — METOPROLOL TARTRATE 50 MG PO TABS: 50.0000 mg | ORAL_TABLET | Freq: Two times a day (BID) | ORAL | Status: DC

## 2012-12-09 NOTE — Addendum Note (Signed)
Addended by: Jimmye Norman on: 12/09/2012 03:17 PM   Modules accepted: Orders

## 2012-12-09 NOTE — Telephone Encounter (Signed)
Okay to try metoprolol 50 mg #180  one twice a day. Suggest office followup in 2-3 weeks

## 2012-12-09 NOTE — Telephone Encounter (Signed)
Is this generic diltiazem extended-release 360 mg  or branded Cardizem 360?

## 2012-12-09 NOTE — Telephone Encounter (Signed)
Pt would like something cheaper to replace Diltaizem (Cardizem CD) 360 mg too expensive. Please advise

## 2012-12-09 NOTE — Telephone Encounter (Signed)
PT called to inquire as to if there was a cheaper version of his diltiazem (CARDIZEM CD) 360 MG 24 hr capsule. Please assist.

## 2012-12-09 NOTE — Telephone Encounter (Signed)
Spoke to pt asked him how much he was paying? Pt stated with new insurance with deductible around $200.00 dollars a month. Told pt okay will check with Dr. Frederica Kuster and get back to him. Pt verbalized understanding.

## 2012-12-09 NOTE — Telephone Encounter (Signed)
Spoke to pt told him Rx for Metoprolol 50 mg one tablet twice a day was sent to pharmacy need to follow up in 2-3 weeks with office visit per Dr. Amador Cunas. Pt verbalized understanding.

## 2012-12-09 NOTE — Telephone Encounter (Signed)
It is Generic.

## 2012-12-09 NOTE — Telephone Encounter (Signed)
Left message on voicemail to call office. Rx sent to pharmacy.  

## 2013-01-16 ENCOUNTER — Other Ambulatory Visit: Payer: Self-pay | Admitting: Internal Medicine

## 2013-02-16 ENCOUNTER — Telehealth: Payer: Self-pay | Admitting: Internal Medicine

## 2013-02-16 DIAGNOSIS — I10 Essential (primary) hypertension: Secondary | ICD-10-CM

## 2013-02-16 MED ORDER — LOSARTAN POTASSIUM-HCTZ 100-25 MG PO TABS: 1.0000 | ORAL_TABLET | Freq: Every day | ORAL | Status: DC

## 2013-02-16 NOTE — Telephone Encounter (Signed)
PT is calling to request a 3 month supply of losartan-hydrochlorothiazide (HYZAAR) 100-25 MG per tablet. He would like it sent to [facility] in [place]. Please assist.

## 2013-02-16 NOTE — Telephone Encounter (Signed)
Pt notified Rx refill sent to pharmacy as requested.

## 2013-04-10 ENCOUNTER — Other Ambulatory Visit: Payer: Self-pay | Admitting: Internal Medicine

## 2013-10-04 ENCOUNTER — Other Ambulatory Visit: Payer: Self-pay | Admitting: Internal Medicine

## 2013-10-09 ENCOUNTER — Telehealth: Payer: Self-pay | Admitting: Internal Medicine

## 2013-10-09 MED ORDER — TRAZODONE HCL 150 MG PO TABS: ORAL_TABLET | ORAL | Status: DC

## 2013-10-09 NOTE — Telephone Encounter (Signed)
Pt has made an appt for cpe, first avail is 5/26.  Pt would like to know if you could refill the traZODone (DESYREL) 150 MG tablet To get him through? [facility]/ [place]

## 2013-10-09 NOTE — Telephone Encounter (Signed)
Pt notified Rx sent to pharmacy

## 2013-11-28 ENCOUNTER — Other Ambulatory Visit (INDEPENDENT_AMBULATORY_CARE_PROVIDER_SITE_OTHER): Payer: BC Managed Care – PPO

## 2013-11-28 DIAGNOSIS — Z0000 Encounter for general adult medical examination without abnormal findings: Secondary | ICD-10-CM

## 2013-11-28 LAB — CBC WITH DIFFERENTIAL/PLATELET
BASOS PCT: 0.4 % (ref 0.0–3.0)
Basophils Absolute: 0 10*3/uL (ref 0.0–0.1)
EOS ABS: 0.3 10*3/uL (ref 0.0–0.7)
EOS PCT: 5.4 % — AB (ref 0.0–5.0)
HEMATOCRIT: 42.1 % (ref 39.0–52.0)
HEMOGLOBIN: 14.6 g/dL (ref 13.0–17.0)
Lymphocytes Relative: 15.7 % (ref 12.0–46.0)
Lymphs Abs: 1 10*3/uL (ref 0.7–4.0)
MCHC: 34.6 g/dL (ref 30.0–36.0)
MCV: 94.1 fl (ref 78.0–100.0)
MONO ABS: 0.5 10*3/uL (ref 0.1–1.0)
Monocytes Relative: 8.9 % (ref 3.0–12.0)
NEUTROS ABS: 4.2 10*3/uL (ref 1.4–7.7)
NEUTROS PCT: 69.6 % (ref 43.0–77.0)
Platelets: 207 10*3/uL (ref 150.0–400.0)
RBC: 4.48 Mil/uL (ref 4.22–5.81)
RDW: 12.9 % (ref 11.5–15.5)
WBC: 6.1 10*3/uL (ref 4.0–10.5)

## 2013-11-28 LAB — POCT URINALYSIS DIPSTICK
Bilirubin, UA: NEGATIVE
Glucose, UA: NEGATIVE
KETONES UA: NEGATIVE
Leukocytes, UA: NEGATIVE
NITRITE UA: NEGATIVE
PH UA: 6.5
PROTEIN UA: NEGATIVE
Spec Grav, UA: 1.02
Urobilinogen, UA: 0.2

## 2013-11-28 LAB — HEPATIC FUNCTION PANEL
ALBUMIN: 4.1 g/dL (ref 3.5–5.2)
ALT: 24 U/L (ref 0–53)
AST: 23 U/L (ref 0–37)
Alkaline Phosphatase: 49 U/L (ref 39–117)
Bilirubin, Direct: 0.1 mg/dL (ref 0.0–0.3)
TOTAL PROTEIN: 6.9 g/dL (ref 6.0–8.3)
Total Bilirubin: 0.7 mg/dL (ref 0.2–1.2)

## 2013-11-28 LAB — BASIC METABOLIC PANEL
BUN: 17 mg/dL (ref 6–23)
CALCIUM: 9.7 mg/dL (ref 8.4–10.5)
CHLORIDE: 100 meq/L (ref 96–112)
CO2: 32 meq/L (ref 19–32)
CREATININE: 1.1 mg/dL (ref 0.4–1.5)
GFR: 75.33 mL/min (ref >60.00)
GLUCOSE: 107 mg/dL — AB (ref 70–99)
Potassium: 4.6 mEq/L (ref 3.5–5.1)
Sodium: 139 mEq/L (ref 135–145)

## 2013-11-28 LAB — LIPID PANEL
Cholesterol: 164 mg/dL (ref 0–200)
HDL: 37.6 mg/dL — AB (ref >39.00)
LDL Cholesterol: 87 mg/dL (ref 0–99)
Total CHOL/HDL Ratio: 4
Triglycerides: 198 mg/dL — ABNORMAL HIGH (ref 0.0–149.0)
VLDL: 39.6 mg/dL (ref 0.0–40.0)

## 2013-11-28 LAB — PSA: PSA: 0.53 ng/mL (ref 0.10–4.00)

## 2013-11-28 LAB — TSH: TSH: 1.15 u[IU]/mL (ref 0.35–4.50)

## 2013-12-05 ENCOUNTER — Ambulatory Visit (INDEPENDENT_AMBULATORY_CARE_PROVIDER_SITE_OTHER): Payer: BC Managed Care – PPO | Admitting: Internal Medicine

## 2013-12-05 ENCOUNTER — Encounter: Payer: Self-pay | Admitting: Internal Medicine

## 2013-12-05 VITALS — BP 130/80 | HR 54 | Temp 97.5°F | Ht 77.0 in | Wt 247.0 lb

## 2013-12-05 DIAGNOSIS — Z0000 Encounter for general adult medical examination without abnormal findings: Secondary | ICD-10-CM

## 2013-12-05 DIAGNOSIS — I4891 Unspecified atrial fibrillation: Secondary | ICD-10-CM

## 2013-12-05 DIAGNOSIS — R7309 Other abnormal glucose: Secondary | ICD-10-CM

## 2013-12-05 DIAGNOSIS — I10 Essential (primary) hypertension: Secondary | ICD-10-CM

## 2013-12-05 DIAGNOSIS — R7302 Impaired glucose tolerance (oral): Secondary | ICD-10-CM

## 2013-12-05 NOTE — Patient Instructions (Addendum)
Limit your sodium (Salt) intake  Please check your blood pressure on a regular basis.  If it is consistently greater than 150/90, please make an office appointment.    It is important that you exercise regularly, at least 20 minutes 3 to 4 times per week.  If you develop chest pain or shortness of breath seek  medical attention.  Health Maintenance, Males A healthy lifestyle and preventative care can promote health and wellness.  Maintain regular health, dental, and eye exams.  Eat a healthy diet. Foods like vegetables, fruits, whole grains, low-fat dairy products, and lean protein foods contain the nutrients you need and are low in calories. Decrease your intake of foods high in solid fats, added sugars, and salt. Get information about a proper diet from your health care provider, if necessary.  Regular physical exercise is one of the most important things you can do for your health. Most adults should get at least 150 minutes of moderate-intensity exercise (any activity that increases your heart rate and causes you to sweat) each week. In addition, most adults need muscle-strengthening exercises on 2 or more days a week.   Maintain a healthy weight. The body mass index (BMI) is a screening tool to identify possible weight problems. It provides an estimate of body fat based on height and weight. Your health care provider can find your BMI and can help you achieve or maintain a healthy weight. For males 20 years and older:  A BMI below 18.5 is considered underweight.  A BMI of 18.5 to 24.9 is normal.  A BMI of 25 to 29.9 is considered overweight.  A BMI of 30 and above is considered obese.  Maintain normal blood lipids and cholesterol by exercising and minimizing your intake of saturated fat. Eat a balanced diet with plenty of fruits and vegetables. Blood tests for lipids and cholesterol should begin at age 22 and be repeated every 5 years. If your lipid or cholesterol levels are high, you  are over 50, or you are at high risk for heart disease, you may need your cholesterol levels checked more frequently.Ongoing high lipid and cholesterol levels should be treated with medicines, if diet and exercise are not working.  If you smoke, find out from your health care provider how to quit. If you do not use tobacco, do not start.  Lung cancer screening is recommended for adults aged 14 80 years who are at high risk for developing lung cancer because of a history of smoking. A yearly low-dose CT scan of the lungs is recommended for people who have at least a 30-pack-year history of smoking and are a current smoker or have quit within the past 15 years. A pack year of smoking is smoking an average of 1 pack of cigarettes a day for 1 year (for example, a 30-pack-year history of smoking could mean smoking 1 pack a day for 30 years or 2 packs a day for 15 years). Yearly screening should continue until the smoker has stopped smoking for at least 15 years. Yearly screening should be stopped for people who develop a health problem that would prevent them from having lung cancer treatment.  If you choose to drink alcohol, do not have more than 2 drinks per day. One drink is considered to be 12 oz (360 mL) of beer, 5 oz (150 mL) of wine, or 1.5 oz (45 mL) of liquor.  Avoid use of street drugs. Do not share needles with anyone. Ask for help if  you need support or instructions about stopping the use of drugs.  High blood pressure causes heart disease and increases the risk of stroke. Blood pressure should be checked at least every 1 2 years. Ongoing high blood pressure should be treated with medicines if weight loss and exercise are not effective.  If you are 68 59 years old, ask your health care provider if you should take aspirin to prevent heart disease.  Diabetes screening involves taking a blood sample to check your fasting blood sugar level. This should be done once every 3 years after age 20, if  you are at a normal weight and without risk factors for diabetes. Testing should be considered at a younger age or be carried out more frequently if you are overweight and have at least 1 risk factor for diabetes.  Colorectal cancer can be detected and often prevented. Most routine colorectal cancer screening begins at the age of 9 and continues through age 56. However, your health care provider may recommend screening at an earlier age if you have risk factors for colon cancer. On a yearly basis, your health care provider may provide home test kits to check for hidden blood in the stool. A small camera at the end of a tube may be used to directly examine the colon (sigmoidoscopy or colonoscopy) to detect the earliest forms of colorectal cancer. Talk to your health care provider about this at age 71, when routine screening begins. A direct exam of the colon should be repeated every 5 10 years through age 78, unless early forms of pre-cancerous polyps or small growths are found.  People who are at an increased risk for hepatitis B should be screened for this virus. You are considered at high risk for hepatitis B if:  You were born in a country where hepatitis B occurs often. Talk with your health care provider about which countries are considered high-risk.  Your parents were born in a high-risk country and you have not received a shot to protect against hepatitis B (hepatitis B vaccine).  You have HIV or AIDS.  You use needles to inject street drugs.  You live with, or have sex with, someone who has hepatitis B.  You are a man who has sex with other men (MSM).  You get hemodialysis treatment.  You take certain medicines for conditions like cancer, organ transplantation, and autoimmune conditions.  Hepatitis C blood testing is recommended for all people born from [birth year] through 1966 and any individual with known risk factors for hepatitis C.  Healthy men should no longer receive  prostate-specific antigen (PSA) blood tests as part of routine cancer screening. Talk to your health care provider about prostate cancer screening.  Testicular cancer screening is not recommended for adolescents or adult males who have no symptoms. Screening includes self-exam, a health care provider exam, and other screening tests. Consult with your health care provider about any symptoms you have or any concerns you have about testicular cancer.  Practice safe sex. Use condoms and avoid high-risk sexual practices to reduce the spread of sexually transmitted infections (STIs).  Use sunscreen. Apply sunscreen liberally and repeatedly throughout the day. You should seek shade when your shadow is shorter than you. Protect yourself by wearing long sleeves, pants, a wide-brimmed hat, and sunglasses year round, whenever you are outdoors.  Tell your health care provider of new moles or changes in moles, especially if there is a change in shape or color. Also tell your provider if a  mole is larger than the size of a pencil eraser.  A one-time screening for abdominal aortic aneurysm (AAA) and surgical repair of large AAAs by ultrasound is recommended for men aged 42 75 years who are current or former smokers.  Stay current with your vaccines (immunizations). Document Released: 12/26/2007 Document Revised: 04/19/2013 Document Reviewed: 11/24/2010 [facility] Patient Information 2014 [place], [place].

## 2013-12-05 NOTE — Progress Notes (Signed)
Pre visit review using our clinic review tool, if applicable. No additional management support is needed unless otherwise documented below in the visit note. 

## 2013-12-05 NOTE — Progress Notes (Signed)
Subjective:    Patient ID: Thomas Baldwin, male    DOB: 09/19/1955, 58 y.o.   MRN: [id number]  HPI 58 year-old patient who has a history of treated hypertension and paroxysmal atrial fibrillation.  He is seen today for an annual exam. The patient has had a normal EKG and a 2-D echocardiogram in the past.  In 2008.  He had a Cardiolite stress test.  Colonoscopy approximately 6-8 years ago He does have a history of some allergic rhinitis.  Family history.  Father is in his 61 AM in reasonably good health.  Mother died young from complications of lupus Social history recently retired within the past 6 months  Past Medical History  Diagnosis Date  . Hypertension   . GERD (gastroesophageal reflux disease)     Questionable  . Atypical chest pain     History   Social History  . Marital Status: Married    Spouse Name: N/A    Number of Children: N/A  . Years of Education: N/A   Occupational History  . Not on file.   Social History Main Topics  . Smoking status: Never Smoker   . Smokeless tobacco: Not on file  . Alcohol Use: Yes  . Drug Use: Not on file  . Sexual Activity: Not on file   Other Topics Concern  . Not on file   Social History Narrative  . No narrative on file    Past Surgical History  Procedure Laterality Date  . Tonsilectomy, adenoidectomy, bilateral myringotomy and tubes      Family History  Problem Relation Age of Onset  . Coronary artery disease    . Diabetes    . Hypertension      No Known Allergies  Current Outpatient Prescriptions on File Prior to Visit  Medication Sig Dispense Refill  . aspirin 81 MG tablet Take 81 mg by mouth daily.      Marland Kitchen losartan-hydrochlorothiazide (HYZAAR) 100-25 MG per tablet Take 1 tablet by mouth daily.  90 tablet  3  . traZODone (DESYREL) 150 MG tablet TAKE ONE TABLET BY MOUTH AT BEDTIME  90 tablet  0   No current facility-administered medications on file prior to visit.    BP 130/80  Pulse 54  Temp(Src) 97.5 F  (36.4 C) (Oral)  Ht 6\' 5"  (1.956 m)  Wt 247 lb (112.038 kg)  BMI 29.28 kg/m2  SpO2 98%       Review of Systems  Constitutional: Negative for fever, chills, appetite change and fatigue.  HENT: Negative for congestion, dental problem, ear pain, hearing loss, sore throat, tinnitus, trouble swallowing and voice change.   Eyes: Negative for pain, discharge and visual disturbance.  Respiratory: Negative for cough, chest tightness, wheezing and stridor.   Cardiovascular: Negative for chest pain, palpitations and leg swelling.  Gastrointestinal: Negative for nausea, vomiting, abdominal pain, diarrhea, constipation, blood in stool and abdominal distention.  Genitourinary: Negative for urgency, hematuria, flank pain, discharge, difficulty urinating and genital sores.  Musculoskeletal: Negative for arthralgias, back pain, gait problem, joint swelling, myalgias and neck stiffness.  Skin: Negative for rash.  Neurological: Positive for headaches. Negative for dizziness, syncope, speech difficulty, weakness and numbness.  Hematological: Negative for adenopathy. Does not bruise/bleed easily.  Psychiatric/Behavioral: Negative for behavioral problems and dysphoric mood. The patient is not nervous/anxious.        Objective:   Physical Exam  Constitutional: He is oriented to person, place, and time. He appears well-developed.  HENT:  Head: Normocephalic.  Right Ear: External ear normal.  Left Ear: External ear normal.  Eyes: Conjunctivae and EOM are normal.  Neck: Normal range of motion.  Cardiovascular: Normal rate, regular rhythm, normal heart sounds and intact distal pulses.   Pulse 50 O2 saturation 97%  Pulmonary/Chest: Effort normal and breath sounds normal. No respiratory distress. He has no wheezes. He has no rales. He exhibits no tenderness.  Abdominal: Bowel sounds are normal.  Musculoskeletal: Normal range of motion. He exhibits no edema and no tenderness.  Neurological: He is alert  and oriented to person, place, and time.  Psychiatric: He has a normal mood and affect. His behavior is normal.          Assessment & Plan:   Preventive health care Hypertension well controlled History of paroxysmal atrial fibrillation  We'll continue aggressive risk factor modification. Followup colonoscopy in 2 years Regular exercise program.  Encouraged

## 2013-12-06 ENCOUNTER — Telehealth: Payer: Self-pay | Admitting: Internal Medicine

## 2013-12-06 NOTE — Telephone Encounter (Signed)
Relevant patient education assigned to patient using Emmi. ° °

## 2013-12-26 ENCOUNTER — Other Ambulatory Visit: Payer: Self-pay | Admitting: Internal Medicine

## 2013-12-26 DIAGNOSIS — I10 Essential (primary) hypertension: Secondary | ICD-10-CM

## 2013-12-26 MED ORDER — LOSARTAN POTASSIUM-HCTZ 100-25 MG PO TABS: 1.0000 | ORAL_TABLET | Freq: Every day | ORAL | Status: DC

## 2013-12-26 NOTE — Telephone Encounter (Signed)
Please add losartan-hydrochlorothiazide (HYZAAR) 100-25 MG per tablet to re-fill request

## 2013-12-26 NOTE — Telephone Encounter (Signed)
Rx sent to pharmacy   

## 2014-02-13 ENCOUNTER — Telehealth: Payer: Self-pay | Admitting: Internal Medicine

## 2014-02-13 MED ORDER — TADALAFIL 20 MG PO TABS: 20.0000 mg | ORAL_TABLET | Freq: Every day | ORAL | Status: DC | PRN

## 2014-02-13 NOTE — Telephone Encounter (Signed)
Cialis 20 #6 one daily as needed.  Refill x3

## 2014-02-13 NOTE — Telephone Encounter (Signed)
Please advise 

## 2014-02-13 NOTE — Telephone Encounter (Signed)
Pt called and request an rx cialis    Pharmacy  [facility] [place]

## 2014-02-13 NOTE — Telephone Encounter (Signed)
Pt notified Rx for Cialis sent to pharmacy.

## 2014-02-14 ENCOUNTER — Telehealth: Payer: Self-pay | Admitting: Internal Medicine

## 2014-02-14 NOTE — Telephone Encounter (Signed)
Pt is currently on rx tadalafil (CIALIS) 20 MG tablet, and wants to know if dr. Raliegh Ip can decrease his dosage to 5 mg, pt states that it was very expensive and would like to know if its cheaper if he lower the dosage.

## 2014-02-15 NOTE — Telephone Encounter (Signed)
Please advise 

## 2014-02-15 NOTE — Telephone Encounter (Signed)
Okay to decrease dose to 5 mg

## 2014-02-16 MED ORDER — TADALAFIL 5 MG PO TABS: 5.0000 mg | ORAL_TABLET | Freq: Every day | ORAL | Status: DC | PRN

## 2014-02-16 NOTE — Telephone Encounter (Signed)
Spoke to pt told him okay to decrease Cialis to 5 mg will send Rx to pharmacy. Pt verbalized understanding and asked to send 30 tablets so he can use voucher. Told pt okay. Rx sent

## 2014-02-20 ENCOUNTER — Telehealth: Payer: Self-pay | Admitting: Internal Medicine

## 2014-02-20 NOTE — Telephone Encounter (Signed)
[employer] denied Cialis.  Pt must try and fail Viagra

## 2014-02-21 NOTE — Telephone Encounter (Signed)
Noted  

## 2014-06-12 ENCOUNTER — Telehealth: Payer: Self-pay | Admitting: Internal Medicine

## 2014-06-12 NOTE — Telephone Encounter (Signed)
Please advise 

## 2014-06-12 NOTE — Telephone Encounter (Signed)
Pt has acid reflux and has been buying Prilosec OTC.  Would like to know if dr Raliegh Ip will write him a 90 day due to cost factors? [facility]/ [place]

## 2014-06-12 NOTE — Telephone Encounter (Signed)
Omeprazole 20 mg #90 one daily, refill times 4

## 2014-06-13 MED ORDER — OMEPRAZOLE 20 MG PO CPDR: 20.0000 mg | DELAYED_RELEASE_CAPSULE | Freq: Every day | ORAL | Status: DC

## 2014-06-13 NOTE — Telephone Encounter (Signed)
Pt notified Rx for Omeprazole was sent to pharmacy.

## 2014-11-30 ENCOUNTER — Other Ambulatory Visit (INDEPENDENT_AMBULATORY_CARE_PROVIDER_SITE_OTHER): Payer: 59

## 2014-11-30 DIAGNOSIS — R7989 Other specified abnormal findings of blood chemistry: Secondary | ICD-10-CM | POA: Diagnosis not present

## 2014-11-30 DIAGNOSIS — Z0000 Encounter for general adult medical examination without abnormal findings: Secondary | ICD-10-CM | POA: Diagnosis not present

## 2014-11-30 LAB — CBC WITH DIFFERENTIAL/PLATELET
BASOS PCT: 0.2 % (ref 0.0–3.0)
Basophils Absolute: 0 10*3/uL (ref 0.0–0.1)
EOS ABS: 0.1 10*3/uL (ref 0.0–0.7)
EOS PCT: 1.9 % (ref 0.0–5.0)
HCT: 46.1 % (ref 39.0–52.0)
HEMOGLOBIN: 15.9 g/dL (ref 13.0–17.0)
Lymphocytes Relative: 15.8 % (ref 12.0–46.0)
Lymphs Abs: 1 10*3/uL (ref 0.7–4.0)
MCHC: 34.6 g/dL (ref 30.0–36.0)
MCV: 91.7 fl (ref 78.0–100.0)
MONO ABS: 0.7 10*3/uL (ref 0.1–1.0)
Monocytes Relative: 10.9 % (ref 3.0–12.0)
NEUTROS ABS: 4.4 10*3/uL (ref 1.4–7.7)
Neutrophils Relative %: 71.2 % (ref 43.0–77.0)
Platelets: 209 10*3/uL (ref 150.0–400.0)
RBC: 5.03 Mil/uL (ref 4.22–5.81)
RDW: 12.6 % (ref 11.5–15.5)
WBC: 6.2 10*3/uL (ref 4.0–10.5)

## 2014-11-30 LAB — BASIC METABOLIC PANEL
BUN: 13 mg/dL (ref 6–23)
CO2: 33 meq/L — AB (ref 19–32)
Calcium: 10 mg/dL (ref 8.4–10.5)
Chloride: 102 mEq/L (ref 96–112)
Creatinine, Ser: 1.16 mg/dL (ref 0.40–1.50)
GFR: 68.39 mL/min (ref >60.00)
Glucose, Bld: 121 mg/dL — ABNORMAL HIGH (ref 70–99)
POTASSIUM: 4.9 meq/L (ref 3.5–5.1)
SODIUM: 142 meq/L (ref 135–145)

## 2014-11-30 LAB — HEPATIC FUNCTION PANEL
ALBUMIN: 4.3 g/dL (ref 3.5–5.2)
ALT: 24 U/L (ref 0–53)
AST: 19 U/L (ref 0–37)
Alkaline Phosphatase: 57 U/L (ref 39–117)
BILIRUBIN DIRECT: 0.1 mg/dL (ref 0.0–0.3)
TOTAL PROTEIN: 7.1 g/dL (ref 6.0–8.3)
Total Bilirubin: 0.7 mg/dL (ref 0.2–1.2)

## 2014-11-30 LAB — LIPID PANEL
Cholesterol: 158 mg/dL (ref 0–200)
HDL: 33.4 mg/dL — ABNORMAL LOW (ref >39.00)
NonHDL: 124.6
Total CHOL/HDL Ratio: 5
Triglycerides: 248 mg/dL — ABNORMAL HIGH (ref 0.0–149.0)
VLDL: 49.6 mg/dL — AB (ref 0.0–40.0)

## 2014-11-30 LAB — POCT URINALYSIS DIPSTICK
BILIRUBIN UA: NEGATIVE
GLUCOSE UA: NEGATIVE
Ketones, UA: NEGATIVE
Leukocytes, UA: NEGATIVE
Nitrite, UA: NEGATIVE
PH UA: 7
SPEC GRAV UA: 1.02
Urobilinogen, UA: 0.2

## 2014-11-30 LAB — LDL CHOLESTEROL, DIRECT: Direct LDL: 94 mg/dL

## 2014-11-30 LAB — PSA: PSA: 0.8 ng/mL (ref 0.10–4.00)

## 2014-11-30 LAB — TSH: TSH: 1.12 u[IU]/mL (ref 0.35–4.50)

## 2014-12-07 ENCOUNTER — Ambulatory Visit (INDEPENDENT_AMBULATORY_CARE_PROVIDER_SITE_OTHER): Payer: 59 | Admitting: Internal Medicine

## 2014-12-07 ENCOUNTER — Encounter: Payer: Self-pay | Admitting: Internal Medicine

## 2014-12-07 VITALS — BP 138/80 | HR 51 | Temp 97.6°F | Resp 20 | Ht 77.25 in | Wt 242.0 lb

## 2014-12-07 DIAGNOSIS — I10 Essential (primary) hypertension: Secondary | ICD-10-CM

## 2014-12-07 DIAGNOSIS — Z23 Encounter for immunization: Secondary | ICD-10-CM

## 2014-12-07 DIAGNOSIS — Z0000 Encounter for general adult medical examination without abnormal findings: Secondary | ICD-10-CM

## 2014-12-07 DIAGNOSIS — R7302 Impaired glucose tolerance (oral): Secondary | ICD-10-CM

## 2014-12-07 DIAGNOSIS — I480 Paroxysmal atrial fibrillation: Secondary | ICD-10-CM | POA: Diagnosis not present

## 2014-12-07 NOTE — Patient Instructions (Addendum)
Limit your sodium (Salt) intake    It is important that you exercise regularly, at least 20 minutes 3 to 4 times per week.  If you develop chest pain or shortness of breath seek  medical attention.  Please check your blood pressure on a regular basis.  If it is consistently greater than 150/90, please make an office appointment.  Return in one year for follow-up Health Maintenance A healthy lifestyle and preventative care can promote health and wellness.  Maintain regular health, dental, and eye exams.  Eat a healthy diet. Foods like vegetables, fruits, whole grains, low-fat dairy products, and lean protein foods contain the nutrients you need and are low in calories. Decrease your intake of foods high in solid fats, added sugars, and salt. Get information about a proper diet from your health care provider, if necessary.  Regular physical exercise is one of the most important things you can do for your health. Most adults should get at least 150 minutes of moderate-intensity exercise (any activity that increases your heart rate and causes you to sweat) each week. In addition, most adults need muscle-strengthening exercises on 2 or more days a week.   Maintain a healthy weight. The body mass index (BMI) is a screening tool to identify possible weight problems. It provides an estimate of body fat based on height and weight. Your health care provider can find your BMI and can help you achieve or maintain a healthy weight. For males 20 years and older:  A BMI below 18.5 is considered underweight.  A BMI of 18.5 to 24.9 is normal.  A BMI of 25 to 29.9 is considered overweight.  A BMI of 30 and above is considered obese.  Maintain normal blood lipids and cholesterol by exercising and minimizing your intake of saturated fat. Eat a balanced diet with plenty of fruits and vegetables. Blood tests for lipids and cholesterol should begin at age 47 and be repeated every 5 years. If your lipid or  cholesterol levels are high, you are over age 32, or you are at high risk for heart disease, you may need your cholesterol levels checked more frequently.Ongoing high lipid and cholesterol levels should be treated with medicines if diet and exercise are not working.  If you smoke, find out from your health care provider how to quit. If you do not use tobacco, do not start.  Lung cancer screening is recommended for adults aged 71-80 years who are at high risk for developing lung cancer because of a history of smoking. A yearly low-dose CT scan of the lungs is recommended for people who have at least a 30-pack-year history of smoking and are current smokers or have quit within the past 15 years. A pack year of smoking is smoking an average of 1 pack of cigarettes a day for 1 year (for example, a 30-pack-year history of smoking could mean smoking 1 pack a day for 30 years or 2 packs a day for 15 years). Yearly screening should continue until the smoker has stopped smoking for at least 15 years. Yearly screening should be stopped for people who develop a health problem that would prevent them from having lung cancer treatment.  If you choose to drink alcohol, do not have more than 2 drinks per day. One drink is considered to be 12 oz (360 mL) of beer, 5 oz (150 mL) of wine, or 1.5 oz (45 mL) of liquor.  Avoid the use of street drugs. Do not share needles with  anyone. Ask for help if you need support or instructions about stopping the use of drugs.  High blood pressure causes heart disease and increases the risk of stroke. Blood pressure should be checked at least every 1-2 years. Ongoing high blood pressure should be treated with medicines if weight loss and exercise are not effective.  If you are 59-65 years old, ask your health care provider if you should take aspirin to prevent heart disease.  Diabetes screening involves taking a blood sample to check your fasting blood sugar level. This should be done  once every 3 years after age 66 if you are at a normal weight and without risk factors for diabetes. Testing should be considered at a younger age or be carried out more frequently if you are overweight and have at least 1 risk factor for diabetes.  Colorectal cancer can be detected and often prevented. Most routine colorectal cancer screening begins at the age of 19 and continues through age 64. However, your health care provider may recommend screening at an earlier age if you have risk factors for colon cancer. On a yearly basis, your health care provider may provide home test kits to check for hidden blood in the stool. A small camera at the end of a tube may be used to directly examine the colon (sigmoidoscopy or colonoscopy) to detect the earliest forms of colorectal cancer. Talk to your health care provider about this at age 1 when routine screening begins. A direct exam of the colon should be repeated every 5-10 years through age 13, unless early forms of precancerous polyps or small growths are found.  People who are at an increased risk for hepatitis B should be screened for this virus. You are considered at high risk for hepatitis B if:  You were born in a country where hepatitis B occurs often. Talk with your health care provider about which countries are considered high risk.  Your parents were born in a high-risk country and you have not received a shot to protect against hepatitis B (hepatitis B vaccine).  You have HIV or AIDS.  You use needles to inject street drugs.  You live with, or have sex with, someone who has hepatitis B.  You are a man who has sex with other men (MSM).  You get hemodialysis treatment.  You take certain medicines for conditions like cancer, organ transplantation, and autoimmune conditions.  Hepatitis C blood testing is recommended for all people born from [birth year] through 1966 and any individual with known risk factors for hepatitis C.  Healthy men should  no longer receive prostate-specific antigen (PSA) blood tests as part of routine cancer screening. Talk to your health care provider about prostate cancer screening.  Testicular cancer screening is not recommended for adolescents or adult males who have no symptoms. Screening includes self-exam, a health care provider exam, and other screening tests. Consult with your health care provider about any symptoms you have or any concerns you have about testicular cancer.  Practice safe sex. Use condoms and avoid high-risk sexual practices to reduce the spread of sexually transmitted infections (STIs).  You should be screened for STIs, including gonorrhea and chlamydia if:  You are sexually active and are younger than 24 years.  You are older than 24 years, and your health care provider tells you that you are at risk for this type of infection.  Your sexual activity has changed since you were last screened, and you are at an increased risk for  chlamydia or gonorrhea. Ask your health care provider if you are at risk.  If you are at risk of being infected with HIV, it is recommended that you take a prescription medicine daily to prevent HIV infection. This is called pre-exposure prophylaxis (PrEP). You are considered at risk if:  You are a man who has sex with other men (MSM).  You are a heterosexual man who is sexually active with multiple partners.  You take drugs by injection.  You are sexually active with a partner who has HIV.  Talk with your health care provider about whether you are at high risk of being infected with HIV. If you choose to begin PrEP, you should first be tested for HIV. You should then be tested every 3 months for as long as you are taking PrEP.  Use sunscreen. Apply sunscreen liberally and repeatedly throughout the day. You should seek shade when your shadow is shorter than you. Protect yourself by wearing long sleeves, pants, a wide-brimmed hat, and sunglasses year round  whenever you are outdoors.  Tell your health care provider of new moles or changes in moles, especially if there is a change in shape or color. Also, tell your health care provider if a mole is larger than the size of a pencil eraser.  A one-time screening for abdominal aortic aneurysm (AAA) and surgical repair of large AAAs by ultrasound is recommended for men aged 50-75 years who are current or former smokers.  Stay current with your vaccines (immunizations). Document Released: 12/26/2007 Document Revised: 07/04/2013 Document Reviewed: 11/24/2010 [facility] Patient Information 2015 [place], [place]. This information is not intended to replace advice given to you by your health care provider. Make sure you discuss any questions you have with your health care provider. Cardiac Diet A cardiac diet can help stop heart disease or a stroke from happening. It involves eating less unhealthy fats and eating more healthy fats.  FOODS TO AVOID OR LIMIT  Limit saturated fats. This type of fat is found in oils and dairy products, such as:  Coconut oil.  Palm oil.  Cocoa butter.  Butter.  Avoid trans-fat or hydrogenated oils. These are found in fried or pre-made baked goods, such as:  Margarine.  Pre-made cookies, cakes, and crackers.  Limit processed meats (hot dogs, deli meats, sausage) to 3 ounces a week.  Limit high-fat meats (marbled meats, fried chicken, or chicken with skin) to 3 ounces a week.  Limit salt (sodium) to 1500 milligrams a day.   Limit sweets and drinks with added sugar to no more than 5 servings a week. One serving is:  1 tablespoon of sugar.  1 tablespoon of jelly or jam.   cup sorbet.  1 cup lemonade.   cup regular soda. EAT MORE OF THE FOLLOWING FOODS Fruit  Eat 4to 5 servings a day. One serving of fruit is:  1 medium whole fruit.   cup dried fruit.   cup of fresh, frozen, or canned fruit.   cup 100% fruit juice. Vegetables  Eat 4 to 5  servings a day. One serving is:  1 cup raw leafy vegetables.   cup raw or cooked, cut-up vegetables.   cup vegetable juice. Whole Grains  Eat 3 servings a day (1 ounce equals 1 serving). Legumes (such as beans, peas, and lentils)   Eat at least 4 servings a week ( cup equals 1 serving). Nuts and Seeds   Eat at least 4 servings a week ( cup equals 1 serving).  Dietary Fiber  Eat 20 to 30 grams a day. Some foods high in dietary fiber include:  Dried beans.  Citrus fruits.  Apples, bananas.  Broccoli, Brussels sprouts, and eggplant.  Oats. Omega-3 Fats  Eat food with omega-3 fats. You can also take a dietary pill (supplement) that has 1 gram of DHA and EPA. Have 3.5 ounces of fatty fish a week, such as:  Salmon.  Mackerel.  [place] tuna.  Sardines.  Lake trout.  Herring. PREPARING YOUR FOOD  Broil, bake, steam, or roast foods. Do not fry food. Do not cook food in butter (fat).  Use non-stick cooking sprays.  Remove skin from poultry, such as chicken and [place].  Remove fat from meat.  Take the fat off the top of stews, soups, and gravy.  Use lemon or herbs to flavor food instead of using butter or margarine.  Use nonfat yogurt, salsa, or low-fat dressings for salads. Document Released: 12/29/2011 Document Reviewed: 12/29/2011 [facility] Patient Information 2015 [place]. This information is not intended to replace advice given to you by your health care provider. Make sure you discuss any questions you have with your health care provider. DASH Eating Plan DASH stands for "Dietary Approaches to Stop Hypertension." The DASH eating plan is a healthy eating plan that has been shown to reduce high blood pressure (hypertension). Additional health benefits may include reducing the risk of type 2 diabetes mellitus, heart disease, and stroke. The DASH eating plan may also help with weight loss. WHAT DO I NEED TO KNOW ABOUT THE DASH EATING PLAN? For the  DASH eating plan, you will follow these general guidelines:  Choose foods with a percent daily value for sodium of less than 5% (as listed on the food label).  Use salt-free seasonings or herbs instead of table salt or sea salt.  Check with your health care provider or pharmacist before using salt substitutes.  Eat lower-sodium products, often labeled as "lower sodium" or "no salt added."  Eat fresh foods.  Eat more vegetables, fruits, and low-fat dairy products.  Choose whole grains. Look for the word "whole" as the first word in the ingredient list.  Choose fish and skinless chicken or [place] more often than red meat. Limit fish, poultry, and meat to 6 oz (170 g) each day.  Limit sweets, desserts, sugars, and sugary drinks.  Choose heart-healthy fats.  Limit cheese to 1 oz (28 g) per day.  Eat more home-cooked food and less restaurant, buffet, and fast food.  Limit fried foods.  Cook foods using methods other than frying.  Limit canned vegetables. If you do use them, rinse them well to decrease the sodium.  When eating at a restaurant, ask that your food be prepared with less salt, or no salt if possible. WHAT FOODS CAN I EAT? Seek help from a dietitian for individual calorie needs. Grains Whole grain or whole wheat bread. Brown rice. Whole grain or whole wheat pasta. Quinoa, bulgur, and whole grain cereals. Low-sodium cereals. Corn or whole wheat flour tortillas. Whole grain cornbread. Whole grain crackers. Low-sodium crackers. Vegetables Fresh or frozen vegetables (raw, steamed, roasted, or grilled). Low-sodium or reduced-sodium tomato and vegetable juices. Low-sodium or reduced-sodium tomato sauce and paste. Low-sodium or reduced-sodium canned vegetables.  Fruits All fresh, canned (in natural juice), or frozen fruits. Meat and Other Protein Products Ground beef (85% or leaner), grass-fed beef, or beef trimmed of fat. Skinless chicken or [place]. Ground chicken or [place].  Pork trimmed of fat. All fish and  seafood. Eggs. Dried beans, peas, or lentils. Unsalted nuts and seeds. Unsalted canned beans. Dairy Low-fat dairy products, such as skim or 1% milk, 2% or reduced-fat cheeses, low-fat ricotta or cottage cheese, or plain low-fat yogurt. Low-sodium or reduced-sodium cheeses. Fats and Oils Tub margarines without trans fats. Light or reduced-fat mayonnaise and salad dressings (reduced sodium). Avocado. Safflower, olive, or canola oils. Natural peanut or almond butter. Other Unsalted popcorn and pretzels. The items listed above may not be a complete list of recommended foods or beverages. Contact your dietitian for more options. WHAT FOODS ARE NOT RECOMMENDED? Grains White bread. White pasta. White rice. Refined cornbread. Bagels and croissants. Crackers that contain trans fat. Vegetables Creamed or fried vegetables. Vegetables in a cheese sauce. Regular canned vegetables. Regular canned tomato sauce and paste. Regular tomato and vegetable juices. Fruits Dried fruits. Canned fruit in light or heavy syrup. Fruit juice. Meat and Other Protein Products Fatty cuts of meat. Ribs, chicken wings, [place], sausage, bologna, salami, chitterlings, fatback, hot dogs, bratwurst, and packaged luncheon meats. Salted nuts and seeds. Canned beans with salt. Dairy Whole or 2% milk, cream, half-and-half, and cream cheese. Whole-fat or sweetened yogurt. Full-fat cheeses or blue cheese. Nondairy creamers and whipped toppings. Processed cheese, cheese spreads, or cheese curds. Condiments Onion and garlic salt, seasoned salt, table salt, and sea salt. Canned and packaged gravies. Worcestershire sauce. Tartar sauce. Barbecue sauce. Teriyaki sauce. Soy sauce, including reduced sodium. Steak sauce. Fish sauce. Oyster sauce. Cocktail sauce. Horseradish. Ketchup and mustard. Meat flavorings and tenderizers. Bouillon cubes. Hot sauce. Tabasco sauce. Marinades. Taco seasonings. Relishes. Fats and  Oils Butter, stick margarine, lard, shortening, ghee, and [place] fat. Coconut, palm kernel, or palm oils. Regular salad dressings. Other Pickles and olives. Salted popcorn and pretzels. The items listed above may not be a complete list of foods and beverages to avoid. Contact your dietitian for more information. WHERE CAN I FIND MORE INFORMATION? National Heart, Lung, and Blood Institute: travelstabloid.com Document Released: 06/18/2011 Document Revised: 11/13/2013 Document Reviewed: 05/03/2013 [facility] Patient Information 2015 [place], [place]. This information is not intended to replace advice given to you by your health care provider. Make sure you discuss any questions you have with your health care provider.

## 2014-12-07 NOTE — Progress Notes (Signed)
Subjective:    Patient ID: Thomas Baldwin, male    DOB: 03/01/1956, 59 y.o.   MRN: [id number]  HPI 59 year-old patient who has a history of treated hypertension and paroxysmal atrial fibrillation.  He is seen today for an annual exam. The patient has had a normal EKG and a 2-D echocardiogram in the past.  In 2008, he had a Cardiolite stress test.  Colonoscopy approximately 6-8 years ago. He does have a history of some allergic rhinitis.  Family history.  Father is in his 61s and in failing  health.  Mother died young from complications of lupus Social history recently retired within the past  [employer] from Last 3 Encounters:  12/07/14 242 lb (109.77 kg)  12/05/13 247 lb (112.038 kg)  08/02/12 248 lb (112.492 kg)    Past Medical History  Diagnosis Date  . Hypertension   . GERD (gastroesophageal reflux disease)     Questionable  . Atypical chest pain     History   Social History  . Marital Status: Married    Spouse Name: N/A  . Number of Children: N/A  . Years of Education: N/A   Occupational History  . Not on file.   Social History Main Topics  . Smoking status: Never Smoker   . Smokeless tobacco: Not on file  . Alcohol Use: Yes  . Drug Use: Not on file  . Sexual Activity: Not on file   Other Topics Concern  . Not on file   Social History Narrative    Past Surgical History  Procedure Laterality Date  . Tonsilectomy, adenoidectomy, bilateral myringotomy and tubes      Family History  Problem Relation Age of Onset  . Coronary artery disease    . Diabetes    . Hypertension      No Known Allergies  Current Outpatient Prescriptions on File Prior to Visit  Medication Sig Dispense Refill  . aspirin 81 MG tablet Take 81 mg by mouth daily.    . Diltiazem HCl (DILACOR XR PO) Take by mouth.    . losartan-hydrochlorothiazide (HYZAAR) 100-25 MG per tablet Take 1 tablet by mouth daily. 90 tablet 3  . metoprolol (LOPRESSOR) 50 MG tablet TAKE ONE TABLET BY  MOUTH TWICE DAILY 180 tablet 3  . omeprazole (PRILOSEC) 20 MG capsule Take 1 capsule (20 mg total) by mouth daily. 90 capsule 4  . tadalafil (CIALIS) 20 MG tablet Take 1 tablet (20 mg total) by mouth daily as needed for erectile dysfunction. 6 tablet 3  . tadalafil (CIALIS) 5 MG tablet Take 1 tablet (5 mg total) by mouth daily as needed for erectile dysfunction. 30 tablet 0  . traZODone (DESYREL) 150 MG tablet TAKE ONE TABLET BY MOUTH AT BEDTIME 90 tablet 3   No current facility-administered medications on file prior to visit.    There were no vitals taken for this visit.       Review of Systems  Constitutional: Negative for fever, chills, appetite change and fatigue.  HENT: Negative for congestion, dental problem, ear pain, hearing loss, sore throat, tinnitus, trouble swallowing and voice change.   Eyes: Negative for pain, discharge and visual disturbance.  Respiratory: Negative for cough, chest tightness, wheezing and stridor.   Cardiovascular: Negative for chest pain, palpitations and leg swelling.  Gastrointestinal: Negative for nausea, vomiting, abdominal pain, diarrhea, constipation, blood in stool and abdominal distention.  Genitourinary: Negative for urgency, hematuria, flank pain, discharge, difficulty urinating and genital sores.  Musculoskeletal:  Negative for myalgias, back pain, joint swelling, arthralgias, gait problem and neck stiffness.  Skin: Negative for rash.  Neurological: Positive for headaches. Negative for dizziness, syncope, speech difficulty, weakness and numbness.  Hematological: Negative for adenopathy. Does not bruise/bleed easily.  Psychiatric/Behavioral: Negative for behavioral problems and dysphoric mood. The patient is not nervous/anxious.        Objective:   Physical Exam  Constitutional: He is oriented to person, place, and time. He appears well-developed.  HENT:  Head: Normocephalic.  Right Ear: External ear normal.  Left Ear: External ear  normal.  Eyes: Conjunctivae and EOM are normal.  Neck: Normal range of motion.  Cardiovascular: Normal rate, regular rhythm, normal heart sounds and intact distal pulses.   Pulse 50 O2 saturation 97%  Pulmonary/Chest: Effort normal and breath sounds normal. No respiratory distress. He has no wheezes. He has no rales. He exhibits no tenderness.  Abdominal: Bowel sounds are normal.  Musculoskeletal: Normal range of motion. He exhibits no edema or tenderness.  Neurological: He is alert and oriented to person, place, and time.  Psychiatric: He has a normal mood and affect. His behavior is normal.          Assessment & Plan:   Preventive health care Hypertension well controlled History of paroxysmal atrial fibrillation  We'll continue aggressive risk factor modification. Followup colonoscopy in 1 years Regular exercise program.  Encouraged

## 2014-12-07 NOTE — Progress Notes (Signed)
Pre visit review using our clinic review tool, if applicable. No additional management support is needed unless otherwise documented below in the visit note. 

## 2014-12-27 ENCOUNTER — Other Ambulatory Visit: Payer: Self-pay | Admitting: Internal Medicine

## 2015-01-28 ENCOUNTER — Other Ambulatory Visit: Payer: Self-pay | Admitting: Internal Medicine

## 2015-07-01 ENCOUNTER — Other Ambulatory Visit: Payer: Self-pay | Admitting: Internal Medicine

## 2015-08-14 ENCOUNTER — Telehealth: Payer: Self-pay | Admitting: Internal Medicine

## 2015-08-14 NOTE — Telephone Encounter (Signed)
Please see message and advise 

## 2015-08-14 NOTE — Telephone Encounter (Signed)
Patient Name: Thomas Baldwin  DOB: 09/27/1955    Initial Comment Caller states his bp has been running high lately even with medication   Nurse Assessment  Nurse: Leilani Merl, RN, Nira Conn Date/Time (Eastern Time): 08/14/2015 11:09:17 AM  Confirm and document reason for call. If symptomatic, describe symptoms. You must click the next button to save text entered. ---Caller states his BP has been running high lately even with medication, it has been like this the last 10 days. Average of 150/100, he has been having headaches off and on too.  Has the patient traveled out of the country within the last 30 days? ---Not Applicable  Does the patient have any new or worsening symptoms? ---Yes  Will a triage be completed? ---Yes  Related visit to physician within the last 2 weeks? ---No  Does the PT have any chronic conditions? (i.e. diabetes, asthma, etc.) ---Yes  List chronic conditions. ---HTN  Is this a behavioral health or substance abuse call? ---No     Guidelines    Guideline Title Affirmed Question Affirmed Notes  High Blood Pressure [1] BP ? 140/90 AND [2] taking BP medications    Final Disposition User   See PCP within 2 Weeks Standifer, RN, [employer] states that he lives far away and would rather not have to come to the office for this, he would rather his doctor change his medication without him having to be seen.   Disagree/Comply: Comply

## 2015-08-14 NOTE — Telephone Encounter (Signed)
Confirm patient is taking diltiazem Call in a new prescription for diltiazem extended release 240.  If he is not taking this medicine.  Currently Needs office visit within the next 2-4 weeks

## 2015-08-14 NOTE — Telephone Encounter (Signed)
Spoke to pt, asked if taking Diltiazem? Pt said no that was changed due to cost. Pt is taking Lorsartan/HCTZ and Metoprolol. Told pt okay let me discuss with Dr. Raliegh Ip again and I will get back to you. Pt verbalized understanding.

## 2015-08-14 NOTE — Telephone Encounter (Signed)
Please advise 

## 2015-08-15 MED ORDER — AMLODIPINE BESYLATE 5 MG PO TABS: 5.0000 mg | ORAL_TABLET | Freq: Every day | ORAL | Status: DC

## 2015-08-15 NOTE — Telephone Encounter (Signed)
Left detailed message on personal voicemail Dr.K said will put you on Amlodipine 5 mg one tablet daily, take with other medications. Continue to monitor blood pressure and he needs to see you in 2-4 weeks for blood pressure follow up. Please call the office at [phone] to schedule appt. I will send Rx to [facility] in [place], any questions please call office. Rx sent.

## 2015-12-26 ENCOUNTER — Other Ambulatory Visit: Payer: Self-pay | Admitting: Internal Medicine

## 2016-01-07 ENCOUNTER — Other Ambulatory Visit: Payer: Self-pay | Admitting: Internal Medicine

## 2016-01-27 ENCOUNTER — Other Ambulatory Visit: Payer: Self-pay | Admitting: Internal Medicine

## 2016-02-11 ENCOUNTER — Other Ambulatory Visit: Payer: Self-pay | Admitting: Internal Medicine

## 2016-02-12 ENCOUNTER — Other Ambulatory Visit (INDEPENDENT_AMBULATORY_CARE_PROVIDER_SITE_OTHER): Payer: BLUE CROSS/BLUE SHIELD

## 2016-02-12 DIAGNOSIS — R7989 Other specified abnormal findings of blood chemistry: Secondary | ICD-10-CM | POA: Diagnosis not present

## 2016-02-12 DIAGNOSIS — Z0000 Encounter for general adult medical examination without abnormal findings: Secondary | ICD-10-CM

## 2016-02-12 LAB — CBC WITH DIFFERENTIAL/PLATELET
BASOS PCT: 0.6 % (ref 0.0–3.0)
Basophils Absolute: 0 10*3/uL (ref 0.0–0.1)
EOS ABS: 0.2 10*3/uL (ref 0.0–0.7)
Eosinophils Relative: 3.2 % (ref 0.0–5.0)
HCT: 43.7 % (ref 39.0–52.0)
HEMOGLOBIN: 15.1 g/dL (ref 13.0–17.0)
LYMPHS ABS: 0.8 10*3/uL (ref 0.7–4.0)
Lymphocytes Relative: 13.6 % (ref 12.0–46.0)
MCHC: 34.6 g/dL (ref 30.0–36.0)
MCV: 93.4 fl (ref 78.0–100.0)
MONO ABS: 0.7 10*3/uL (ref 0.1–1.0)
Monocytes Relative: 10.5 % (ref 3.0–12.0)
NEUTROS PCT: 72.1 % (ref 43.0–77.0)
Neutro Abs: 4.5 10*3/uL (ref 1.4–7.7)
PLATELETS: 202 10*3/uL (ref 150.0–400.0)
RBC: 4.68 Mil/uL (ref 4.22–5.81)
RDW: 12.7 % (ref 11.5–15.5)
WBC: 6.2 10*3/uL (ref 4.0–10.5)

## 2016-02-12 LAB — LDL CHOLESTEROL, DIRECT: LDL DIRECT: 91 mg/dL

## 2016-02-12 LAB — LIPID PANEL
CHOL/HDL RATIO: 4
CHOLESTEROL: 150 mg/dL (ref 0–200)
HDL: 34.7 mg/dL — AB (ref >39.00)
NONHDL: 115.36
TRIGLYCERIDES: 243 mg/dL — AB (ref 0.0–149.0)
VLDL: 48.6 mg/dL — AB (ref 0.0–40.0)

## 2016-02-12 LAB — BASIC METABOLIC PANEL
BUN: 18 mg/dL (ref 6–23)
CHLORIDE: 100 meq/L (ref 96–112)
CO2: 33 mEq/L — ABNORMAL HIGH (ref 19–32)
CREATININE: 1.08 mg/dL (ref 0.40–1.50)
Calcium: 10 mg/dL (ref 8.4–10.5)
GFR: 73.97 mL/min (ref >60.00)
GLUCOSE: 116 mg/dL — AB (ref 70–99)
Potassium: 4.5 mEq/L (ref 3.5–5.1)
Sodium: 141 mEq/L (ref 135–145)

## 2016-02-12 LAB — POC URINALSYSI DIPSTICK (AUTOMATED)
BILIRUBIN UA: NEGATIVE
GLUCOSE UA: NEGATIVE
Ketones, UA: NEGATIVE
Leukocytes, UA: NEGATIVE
Nitrite, UA: NEGATIVE
Protein, UA: NEGATIVE
SPEC GRAV UA: 1.02
UROBILINOGEN UA: 0.2
pH, UA: 6.5

## 2016-02-12 LAB — HEPATIC FUNCTION PANEL
ALT: 23 U/L (ref 0–53)
AST: 21 U/L (ref 0–37)
Albumin: 4.5 g/dL (ref 3.5–5.2)
Alkaline Phosphatase: 56 U/L (ref 39–117)
BILIRUBIN DIRECT: 0.1 mg/dL (ref 0.0–0.3)
BILIRUBIN TOTAL: 0.7 mg/dL (ref 0.2–1.2)
Total Protein: 7.2 g/dL (ref 6.0–8.3)

## 2016-02-12 LAB — PSA: PSA: 0.72 ng/mL (ref 0.10–4.00)

## 2016-02-12 LAB — TSH: TSH: 1.17 u[IU]/mL (ref 0.35–4.50)

## 2016-02-18 ENCOUNTER — Encounter: Payer: Self-pay | Admitting: Internal Medicine

## 2016-02-18 ENCOUNTER — Ambulatory Visit (INDEPENDENT_AMBULATORY_CARE_PROVIDER_SITE_OTHER): Payer: BLUE CROSS/BLUE SHIELD | Admitting: Internal Medicine

## 2016-02-18 VITALS — BP 110/80 | HR 74 | Temp 97.6°F | Ht 76.5 in | Wt 244.0 lb

## 2016-02-18 DIAGNOSIS — I10 Essential (primary) hypertension: Secondary | ICD-10-CM

## 2016-02-18 DIAGNOSIS — R7302 Impaired glucose tolerance (oral): Secondary | ICD-10-CM

## 2016-02-18 DIAGNOSIS — Z0000 Encounter for general adult medical examination without abnormal findings: Secondary | ICD-10-CM

## 2016-02-18 MED ORDER — SILDENAFIL CITRATE 100 MG PO TABS: 50.0000 mg | ORAL_TABLET | Freq: Every day | ORAL | 11 refills | Status: DC | PRN

## 2016-02-18 NOTE — Progress Notes (Signed)
Subjective:    Patient ID: Thomas Baldwin, male    DOB: 24-Sep-1955, 60 y.o.   MRN: [id number]  HPI 60  year-old patient who Is seen for a preventive health examination.   He has a history of treated hypertension and paroxysmal atrial fibrillation.  He is seen today for an annual exam. The patient has had a normal EKG and a 2-D echocardiogram in the past.  In 2008, he had a Cardiolite stress test.  Colonoscopy approximately 7-9 years ago. He does have a history of some allergic rhinitis.  Family history.  Father died in his early nineties.  Mother died young from complications of lupus Social history recently retired within the past  [employer] from Last 3 Encounters:  02/18/16 244 lb (110.7 kg)  12/07/14 242 lb (109.8 kg)  12/05/13 247 lb (112 kg)    Past Medical History:  Diagnosis Date  . Atypical chest pain   . GERD (gastroesophageal reflux disease)    Questionable  . Hypertension     Social History   Social History  . Marital status: Married    Spouse name: N/A  . Number of children: N/A  . Years of education: N/A   Occupational History  . Not on file.   Social History Main Topics  . Smoking status: Never Smoker  . Smokeless tobacco: Not on file  . Alcohol use Yes  . Drug use: Unknown  . Sexual activity: Not on file   Other Topics Concern  . Not on file   Social History Narrative  . No narrative on file    Past Surgical History:  Procedure Laterality Date  . TONSILECTOMY, ADENOIDECTOMY, BILATERAL MYRINGOTOMY AND TUBES      Family History  Problem Relation Age of Onset  . Coronary artery disease    . Diabetes    . Hypertension      No Known Allergies  Current Outpatient Prescriptions on File Prior to Visit  Medication Sig Dispense Refill  . amLODipine (NORVASC) 5 MG tablet TAKE ONE TABLET BY MOUTH ONCE DAILY 90 tablet 0  . aspirin 81 MG tablet Take 81 mg by mouth daily.    Marland Kitchen losartan-hydrochlorothiazide (HYZAAR) 100-25 MG tablet TAKE ONE  TABLET BY MOUTH ONCE DAILY 90 tablet 1  . metoprolol (LOPRESSOR) 50 MG tablet TAKE ONE TABLET BY MOUTH TWICE DAILY 180 tablet 0  . omeprazole (PRILOSEC) 20 MG capsule TAKE ONE CAPSULE BY MOUTH ONCE DAILY 90 capsule 3  . tadalafil (CIALIS) 20 MG tablet Take 1 tablet (20 mg total) by mouth daily as needed for erectile dysfunction. 6 tablet 3  . tadalafil (CIALIS) 5 MG tablet Take 1 tablet (5 mg total) by mouth daily as needed for erectile dysfunction. 30 tablet 0  . traZODone (DESYREL) 150 MG tablet TAKE ONE TABLET BY MOUTH AT BEDTIME 90 tablet 1   No current facility-administered medications on file prior to visit.     BP 110/80 (BP Location: Left Arm, Patient Position: Sitting, Cuff Size: Normal)   Pulse 74   Temp 97.6 F (36.4 C) (Oral)   Ht 6' 4.5" (1.943 m)   Wt 244 lb (110.7 kg)   SpO2 97%   BMI 29.31 kg/m        Review of Systems  Constitutional: Negative for appetite change, chills, fatigue and fever.  HENT: Negative for congestion, dental problem, ear pain, hearing loss, sore throat, tinnitus, trouble swallowing and voice change.   Eyes: Negative for pain, discharge and visual  disturbance.  Respiratory: Negative for cough, chest tightness, wheezing and stridor.   Cardiovascular: Negative for chest pain, palpitations and leg swelling.  Gastrointestinal: Negative for abdominal distention, abdominal pain, blood in stool, constipation, diarrhea, nausea and vomiting.  Genitourinary: Negative for difficulty urinating, discharge, flank pain, genital sores, hematuria and urgency.  Musculoskeletal: Negative for arthralgias, back pain, gait problem, joint swelling, myalgias and neck stiffness.  Skin: Negative for rash.  Neurological: Positive for headaches. Negative for dizziness, syncope, speech difficulty, weakness and numbness.  Hematological: Negative for adenopathy. Does not bruise/bleed easily.  Psychiatric/Behavioral: Negative for behavioral problems and dysphoric mood. The  patient is not nervous/anxious.        Objective:   Physical Exam  Constitutional: He is oriented to person, place, and time. He appears well-developed.  HENT:  Head: Normocephalic.  Right Ear: External ear normal.  Left Ear: External ear normal.  Eyes: Conjunctivae and EOM are normal.  Neck: Normal range of motion.  Cardiovascular: Normal rate, regular rhythm, normal heart sounds and intact distal pulses.   Pulse 50 O2 saturation 97%  Pulmonary/Chest: Effort normal and breath sounds normal. No respiratory distress. He has no wheezes. He has no rales. He exhibits no tenderness.  Abdominal: Bowel sounds are normal.  Musculoskeletal: Normal range of motion. He exhibits no edema or tenderness.  Neurological: He is alert and oriented to person, place, and time.  Psychiatric: He has a normal mood and affect. His behavior is normal.          Assessment & Plan:   Preventive health care Hypertension well controlled History of paroxysmal atrial fibrillation  We'll continue aggressive risk factor modification. Followup colonoscopy Regular exercise program.  Encouraged  Nyoka Cowden, MD

## 2016-02-18 NOTE — Progress Notes (Signed)
Pre visit review using our clinic review tool, if applicable. No additional management support is needed unless otherwise documented below in the visit note. 

## 2016-02-18 NOTE — Patient Instructions (Signed)
Limit your sodium (Salt) intake  Please check your blood pressure on a regular basis.  If it is consistently greater than 150/90, please make an office appointment.    It is important that you exercise regularly, at least 20 minutes 3 to 4 times per week.  If you develop chest pain or shortness of breath seek  medical attention.  Schedule your colonoscopy to help detect colon cancer.

## 2016-03-20 ENCOUNTER — Encounter: Payer: Self-pay | Admitting: Internal Medicine

## 2016-05-14 ENCOUNTER — Other Ambulatory Visit: Payer: Self-pay | Admitting: Internal Medicine

## 2016-06-24 ENCOUNTER — Other Ambulatory Visit: Payer: Self-pay | Admitting: Internal Medicine

## 2016-07-30 ENCOUNTER — Other Ambulatory Visit: Payer: Self-pay | Admitting: Internal Medicine

## 2016-10-20 DIAGNOSIS — H33301 Unspecified retinal break, right eye: Secondary | ICD-10-CM | POA: Diagnosis not present

## 2016-10-20 DIAGNOSIS — H2511 Age-related nuclear cataract, right eye: Secondary | ICD-10-CM | POA: Diagnosis not present

## 2016-10-20 DIAGNOSIS — H4311 Vitreous hemorrhage, right eye: Secondary | ICD-10-CM | POA: Diagnosis not present

## 2016-10-20 DIAGNOSIS — H35431 Paving stone degeneration of retina, right eye: Secondary | ICD-10-CM | POA: Diagnosis not present

## 2016-10-20 DIAGNOSIS — H43811 Vitreous degeneration, right eye: Secondary | ICD-10-CM | POA: Diagnosis not present

## 2016-11-04 ENCOUNTER — Other Ambulatory Visit: Payer: Self-pay | Admitting: Internal Medicine

## 2016-11-16 ENCOUNTER — Other Ambulatory Visit: Payer: Self-pay | Admitting: Internal Medicine

## 2016-12-17 ENCOUNTER — Telehealth: Payer: Self-pay | Admitting: Internal Medicine

## 2016-12-17 NOTE — Telephone Encounter (Signed)
RECALL FOR TCS °

## 2016-12-17 NOTE — Telephone Encounter (Signed)
Letter mailed to pt.  

## 2016-12-29 ENCOUNTER — Other Ambulatory Visit: Payer: Self-pay | Admitting: Internal Medicine

## 2017-02-04 ENCOUNTER — Other Ambulatory Visit: Payer: Self-pay | Admitting: Internal Medicine

## 2017-02-08 ENCOUNTER — Other Ambulatory Visit (INDEPENDENT_AMBULATORY_CARE_PROVIDER_SITE_OTHER): Payer: BLUE CROSS/BLUE SHIELD

## 2017-02-08 DIAGNOSIS — R7302 Impaired glucose tolerance (oral): Secondary | ICD-10-CM | POA: Diagnosis not present

## 2017-02-08 DIAGNOSIS — Z125 Encounter for screening for malignant neoplasm of prostate: Secondary | ICD-10-CM | POA: Diagnosis not present

## 2017-02-08 DIAGNOSIS — Z0000 Encounter for general adult medical examination without abnormal findings: Secondary | ICD-10-CM

## 2017-02-08 DIAGNOSIS — R7989 Other specified abnormal findings of blood chemistry: Secondary | ICD-10-CM

## 2017-02-08 DIAGNOSIS — I10 Essential (primary) hypertension: Secondary | ICD-10-CM

## 2017-02-08 LAB — CBC WITH DIFFERENTIAL/PLATELET
BASOS ABS: 0 10*3/uL (ref 0.0–0.1)
BASOS PCT: 0.6 % (ref 0.0–3.0)
EOS ABS: 0.2 10*3/uL (ref 0.0–0.7)
Eosinophils Relative: 3.7 % (ref 0.0–5.0)
HEMATOCRIT: 43.9 % (ref 39.0–52.0)
HEMOGLOBIN: 15.3 g/dL (ref 13.0–17.0)
LYMPHS PCT: 14.8 % (ref 12.0–46.0)
Lymphs Abs: 1 10*3/uL (ref 0.7–4.0)
MCHC: 34.8 g/dL (ref 30.0–36.0)
MCV: 95.1 fl (ref 78.0–100.0)
MONO ABS: 0.7 10*3/uL (ref 0.1–1.0)
Monocytes Relative: 10.5 % (ref 3.0–12.0)
Neutro Abs: 4.7 10*3/uL (ref 1.4–7.7)
Neutrophils Relative %: 70.4 % (ref 43.0–77.0)
Platelets: 196 10*3/uL (ref 150.0–400.0)
RBC: 4.61 Mil/uL (ref 4.22–5.81)
RDW: 12.9 % (ref 11.5–15.5)
WBC: 6.7 10*3/uL (ref 4.0–10.5)

## 2017-02-08 LAB — LIPID PANEL
CHOL/HDL RATIO: 5
CHOLESTEROL: 155 mg/dL (ref 0–200)
HDL: 32.3 mg/dL — ABNORMAL LOW (ref >39.00)
NonHDL: 122.62
TRIGLYCERIDES: 310 mg/dL — AB (ref 0.0–149.0)
VLDL: 62 mg/dL — ABNORMAL HIGH (ref 0.0–40.0)

## 2017-02-08 LAB — BASIC METABOLIC PANEL
BUN: 15 mg/dL (ref 6–23)
CHLORIDE: 100 meq/L (ref 96–112)
CO2: 35 mEq/L — ABNORMAL HIGH (ref 19–32)
CREATININE: 1.16 mg/dL (ref 0.40–1.50)
Calcium: 9.5 mg/dL (ref 8.4–10.5)
GFR: 67.89 mL/min (ref >60.00)
Glucose, Bld: 106 mg/dL — ABNORMAL HIGH (ref 70–99)
Potassium: 3.9 mEq/L (ref 3.5–5.1)
Sodium: 140 mEq/L (ref 135–145)

## 2017-02-08 LAB — TSH: TSH: 1.46 u[IU]/mL (ref 0.35–4.50)

## 2017-02-08 LAB — LDL CHOLESTEROL, DIRECT: Direct LDL: 80 mg/dL

## 2017-02-08 LAB — POC URINALSYSI DIPSTICK (AUTOMATED)
Bilirubin, UA: NEGATIVE
Glucose, UA: NEGATIVE
Ketones, UA: NEGATIVE
LEUKOCYTES UA: NEGATIVE
Nitrite, UA: NEGATIVE
PH UA: 6 (ref 5.0–8.0)
PROTEIN UA: NEGATIVE
Spec Grav, UA: 1.025 (ref 1.010–1.025)
UROBILINOGEN UA: 0.2 U/dL

## 2017-02-08 LAB — HEPATIC FUNCTION PANEL
ALBUMIN: 4.3 g/dL (ref 3.5–5.2)
ALT: 20 U/L (ref 0–53)
AST: 17 U/L (ref 0–37)
Alkaline Phosphatase: 57 U/L (ref 39–117)
BILIRUBIN TOTAL: 0.6 mg/dL (ref 0.2–1.2)
Bilirubin, Direct: 0.1 mg/dL (ref 0.0–0.3)
TOTAL PROTEIN: 6.9 g/dL (ref 6.0–8.3)

## 2017-02-08 LAB — HEMOGLOBIN A1C: Hgb A1c MFr Bld: 5.6 % (ref 4.6–6.5)

## 2017-02-08 LAB — PSA: PSA: 0.61 ng/mL (ref 0.10–4.00)

## 2017-02-17 ENCOUNTER — Ambulatory Visit (INDEPENDENT_AMBULATORY_CARE_PROVIDER_SITE_OTHER): Payer: BLUE CROSS/BLUE SHIELD | Admitting: Internal Medicine

## 2017-02-17 ENCOUNTER — Encounter: Payer: Self-pay | Admitting: Internal Medicine

## 2017-02-17 VITALS — BP 124/82 | HR 66 | Temp 97.5°F | Ht 76.0 in | Wt 243.0 lb

## 2017-02-17 DIAGNOSIS — Z0000 Encounter for general adult medical examination without abnormal findings: Secondary | ICD-10-CM | POA: Diagnosis not present

## 2017-02-17 MED ORDER — LOSARTAN POTASSIUM 100 MG PO TABS: 100.0000 mg | ORAL_TABLET | Freq: Every day | ORAL | 3 refills | Status: DC

## 2017-02-17 NOTE — Patient Instructions (Signed)
Limit your sodium (Salt) intake  Please check your blood pressure on a regular basis.  If it is consistently greater than 150/90, please make an office appointment.  Schedule your colonoscopy to help detect colon cancer.  Return in one year for follow-up

## 2017-02-17 NOTE — Progress Notes (Signed)
   Subjective:    Patient ID: Thomas Baldwin, male    DOB: [birth year], 61 y.o.   MRN: [id number]  HPI    Review of Systems     Objective:   Physical Exam        Assessment & Plan:

## 2017-02-17 NOTE — Progress Notes (Signed)
Subjective:    Patient ID: Thomas Baldwin, male    DOB: Jul 17, 1955, 61 y.o.   MRN: [id number]  HPI  61 year old patient who is seen today for a preventive health examination.  He has done quite well over the past year.  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 He does have remote history of paroxysmal atrial fibrillation and a chads score of 1 He has essential hypertension which has been stable. He does describe some occasional lightheadedness when he stands from a stooped or sitting position.  Occasionally  Past Medical History:  Diagnosis Date  . Atypical chest pain   . GERD (gastroesophageal reflux disease)    Questionable  . Hypertension      Social History   Social History  . Marital status: Married    Spouse name: N/A  . Number of children: N/A  . Years of education: N/A   Occupational History  . Not on file.   Social History Main Topics  . Smoking status: Never Smoker  . Smokeless tobacco: Never Used  . Alcohol use Yes  . Drug use: Unknown  . Sexual activity: Not on file   Other Topics Concern  . Not on file   Social History Narrative  . No narrative on file    Past Surgical History:  Procedure Laterality Date  . TONSILECTOMY, ADENOIDECTOMY, BILATERAL MYRINGOTOMY AND TUBES      Family History  Problem Relation Age of Onset  . Coronary artery disease Unknown   . Diabetes Unknown   . Hypertension Unknown     No Known Allergies  Current Outpatient Prescriptions on File Prior to Visit  Medication Sig Dispense Refill  . amLODipine (NORVASC) 5 MG tablet TAKE ONE TABLET BY MOUTH ONCE DAILY 90 tablet 1  . aspirin 81 MG tablet Take 81 mg by mouth daily.    . metoprolol (LOPRESSOR) 50 MG tablet TAKE ONE TABLET BY MOUTH TWICE DAILY 180 tablet 1  . omeprazole (PRILOSEC) 20 MG capsule TAKE ONE CAPSULE BY MOUTH ONCE DAILY 90 capsule 3  . sildenafil (VIAGRA) 100 MG tablet Take 0.5-1 tablets (50-100 mg total) by  mouth daily as needed for erectile dysfunction. 5 tablet 11  . tadalafil (CIALIS) 20 MG tablet Take 1 tablet (20 mg total) by mouth daily as needed for erectile dysfunction. 6 tablet 3  . tadalafil (CIALIS) 5 MG tablet Take 1 tablet (5 mg total) by mouth daily as needed for erectile dysfunction. 30 tablet 0  . traZODone (DESYREL) 150 MG tablet TAKE ONE TABLET BY MOUTH AT BEDTIME 90 tablet 1   No current facility-administered medications on file prior to visit.     BP 124/82 (BP Location: Left Arm, Patient Position: Sitting, Cuff Size: Normal)   Pulse 66   Temp (!) 97.5 F (36.4 C) (Oral)   Ht 6\' 4"  (1.93 m)   Wt 243 lb (110.2 kg)   SpO2 96%   BMI 29.58 kg/m     Review of Systems  Constitutional: Negative for appetite change, chills, fatigue and fever.  HENT: Negative for congestion, dental problem, ear pain, hearing loss, sore throat, tinnitus, trouble swallowing and voice change.   Eyes: Negative for pain, discharge and visual disturbance.  Respiratory: Negative for cough, chest tightness, wheezing and stridor.   Cardiovascular: Negative for chest pain, palpitations and leg swelling.  Gastrointestinal: Negative for abdominal distention, abdominal pain, blood in stool, constipation, diarrhea, nausea and vomiting.  Genitourinary: Negative for difficulty  urinating, discharge, flank pain, genital sores, hematuria and urgency.  Musculoskeletal: Negative for arthralgias, back pain, gait problem, joint swelling, myalgias and neck stiffness.  Skin: Negative for rash.  Neurological: Positive for dizziness and light-headedness. Negative for syncope, speech difficulty, weakness, numbness and headaches.  Hematological: Negative for adenopathy. Does not bruise/bleed easily.  Psychiatric/Behavioral: Negative for behavioral problems and dysphoric mood. The patient is not nervous/anxious.        Objective:   Physical Exam  Constitutional: He is oriented to person, place, and time. He appears  well-developed.  Blood pressure 120/78  HENT:  Head: Normocephalic.  Right Ear: External ear normal.  Left Ear: External ear normal.  Eyes: Conjunctivae and EOM are normal.  Neck: Normal range of motion.  Cardiovascular: Normal rate and normal heart sounds.   Few ectopics noted  Pulmonary/Chest: Breath sounds normal.  Abdominal: Bowel sounds are normal.  Musculoskeletal: Normal range of motion. He exhibits no edema or tenderness.  Neurological: He is alert and oriented to person, place, and time.  Psychiatric: He has a normal mood and affect. His behavior is normal.          Assessment & Plan:   Preventive health examination.  Patient needs follow-up colonoscopy.  We'll schedule Essential hypertension, stable.  We'll discontinue hydrochlorothiazide but continue triple therapy.  We'll continue home blood pressure monitoring History of PAF.  Stable  Follow-up one year Review screening lab  [facility]

## 2017-03-16 DIAGNOSIS — H2511 Age-related nuclear cataract, right eye: Secondary | ICD-10-CM | POA: Diagnosis not present

## 2017-03-16 DIAGNOSIS — H4311 Vitreous hemorrhage, right eye: Secondary | ICD-10-CM | POA: Diagnosis not present

## 2017-03-16 DIAGNOSIS — H43811 Vitreous degeneration, right eye: Secondary | ICD-10-CM | POA: Diagnosis not present

## 2017-03-16 DIAGNOSIS — H33301 Unspecified retinal break, right eye: Secondary | ICD-10-CM | POA: Diagnosis not present

## 2017-04-22 ENCOUNTER — Encounter: Payer: Self-pay | Admitting: Internal Medicine

## 2017-05-21 ENCOUNTER — Other Ambulatory Visit: Payer: Self-pay | Admitting: Internal Medicine

## 2017-06-27 ENCOUNTER — Other Ambulatory Visit: Payer: Self-pay | Admitting: Internal Medicine

## 2017-08-03 ENCOUNTER — Other Ambulatory Visit: Payer: Self-pay | Admitting: Internal Medicine

## 2017-12-07 ENCOUNTER — Other Ambulatory Visit: Payer: Self-pay | Admitting: Internal Medicine

## 2017-12-29 ENCOUNTER — Other Ambulatory Visit: Payer: Self-pay | Admitting: Internal Medicine

## 2018-01-03 DIAGNOSIS — H43811 Vitreous degeneration, right eye: Secondary | ICD-10-CM | POA: Diagnosis not present

## 2018-01-03 DIAGNOSIS — H2511 Age-related nuclear cataract, right eye: Secondary | ICD-10-CM | POA: Diagnosis not present

## 2018-01-03 DIAGNOSIS — H33301 Unspecified retinal break, right eye: Secondary | ICD-10-CM | POA: Diagnosis not present

## 2018-01-03 DIAGNOSIS — H00014 Hordeolum externum left upper eyelid: Secondary | ICD-10-CM | POA: Diagnosis not present

## 2018-01-21 DIAGNOSIS — H2512 Age-related nuclear cataract, left eye: Secondary | ICD-10-CM | POA: Diagnosis not present

## 2018-01-21 DIAGNOSIS — H25811 Combined forms of age-related cataract, right eye: Secondary | ICD-10-CM | POA: Diagnosis not present

## 2018-01-25 ENCOUNTER — Other Ambulatory Visit: Payer: Self-pay | Admitting: Internal Medicine

## 2018-03-02 DIAGNOSIS — H25811 Combined forms of age-related cataract, right eye: Secondary | ICD-10-CM | POA: Diagnosis not present

## 2018-03-02 DIAGNOSIS — H52221 Regular astigmatism, right eye: Secondary | ICD-10-CM | POA: Diagnosis not present

## 2018-03-17 DIAGNOSIS — H33301 Unspecified retinal break, right eye: Secondary | ICD-10-CM | POA: Diagnosis not present

## 2018-03-17 DIAGNOSIS — H2512 Age-related nuclear cataract, left eye: Secondary | ICD-10-CM | POA: Diagnosis not present

## 2018-03-17 DIAGNOSIS — H43811 Vitreous degeneration, right eye: Secondary | ICD-10-CM | POA: Diagnosis not present

## 2018-05-09 DIAGNOSIS — H2512 Age-related nuclear cataract, left eye: Secondary | ICD-10-CM | POA: Diagnosis not present

## 2018-05-11 DIAGNOSIS — H52222 Regular astigmatism, left eye: Secondary | ICD-10-CM | POA: Diagnosis not present

## 2018-05-11 DIAGNOSIS — H25812 Combined forms of age-related cataract, left eye: Secondary | ICD-10-CM | POA: Diagnosis not present

## 2018-05-11 DIAGNOSIS — H2512 Age-related nuclear cataract, left eye: Secondary | ICD-10-CM | POA: Diagnosis not present

## 2018-06-02 ENCOUNTER — Other Ambulatory Visit: Payer: Self-pay | Admitting: Internal Medicine

## 2018-06-03 ENCOUNTER — Other Ambulatory Visit: Payer: Self-pay | Admitting: Internal Medicine

## 2018-06-07 ENCOUNTER — Other Ambulatory Visit: Payer: Self-pay | Admitting: Internal Medicine

## 2018-06-07 ENCOUNTER — Ambulatory Visit: Payer: BLUE CROSS/BLUE SHIELD | Admitting: Internal Medicine

## 2018-06-07 ENCOUNTER — Encounter: Payer: Self-pay | Admitting: Internal Medicine

## 2018-06-07 VITALS — BP 130/90 | HR 69 | Temp 97.9°F | Ht 78.0 in | Wt 249.1 lb

## 2018-06-07 DIAGNOSIS — I5032 Chronic diastolic (congestive) heart failure: Secondary | ICD-10-CM | POA: Diagnosis not present

## 2018-06-07 DIAGNOSIS — E559 Vitamin D deficiency, unspecified: Secondary | ICD-10-CM

## 2018-06-07 DIAGNOSIS — Z0000 Encounter for general adult medical examination without abnormal findings: Secondary | ICD-10-CM | POA: Diagnosis not present

## 2018-06-07 DIAGNOSIS — I10 Essential (primary) hypertension: Secondary | ICD-10-CM | POA: Diagnosis not present

## 2018-06-07 DIAGNOSIS — R7302 Impaired glucose tolerance (oral): Secondary | ICD-10-CM

## 2018-06-07 DIAGNOSIS — I480 Paroxysmal atrial fibrillation: Secondary | ICD-10-CM | POA: Diagnosis not present

## 2018-06-07 LAB — CBC WITH DIFFERENTIAL/PLATELET
BASOS PCT: 0.4 % (ref 0.0–3.0)
Basophils Absolute: 0 10*3/uL (ref 0.0–0.1)
Eosinophils Absolute: 0.3 10*3/uL (ref 0.0–0.7)
Eosinophils Relative: 4.6 % (ref 0.0–5.0)
HCT: 42.8 % (ref 39.0–52.0)
Hemoglobin: 14.8 g/dL (ref 13.0–17.0)
LYMPHS ABS: 0.9 10*3/uL (ref 0.7–4.0)
Lymphocytes Relative: 14.1 % (ref 12.0–46.0)
MCHC: 34.5 g/dL (ref 30.0–36.0)
MCV: 95.3 fl (ref 78.0–100.0)
MONO ABS: 0.7 10*3/uL (ref 0.1–1.0)
Monocytes Relative: 10.6 % (ref 3.0–12.0)
NEUTROS PCT: 70.3 % (ref 43.0–77.0)
Neutro Abs: 4.4 10*3/uL (ref 1.4–7.7)
PLATELETS: 186 10*3/uL (ref 150.0–400.0)
RBC: 4.49 Mil/uL (ref 4.22–5.81)
RDW: 12.9 % (ref 11.5–15.5)
WBC: 6.2 10*3/uL (ref 4.0–10.5)

## 2018-06-07 LAB — LIPID PANEL
CHOLESTEROL: 149 mg/dL (ref 0–200)
HDL: 34.9 mg/dL — ABNORMAL LOW (ref >39.00)
LDL CALC: 82 mg/dL (ref 0–99)
NonHDL: 114.53
Total CHOL/HDL Ratio: 4
Triglycerides: 163 mg/dL — ABNORMAL HIGH (ref 0.0–149.0)
VLDL: 32.6 mg/dL (ref 0.0–40.0)

## 2018-06-07 LAB — COMPREHENSIVE METABOLIC PANEL
ALT: 22 U/L (ref 0–53)
AST: 18 U/L (ref 0–37)
Albumin: 4.4 g/dL (ref 3.5–5.2)
Alkaline Phosphatase: 63 U/L (ref 39–117)
BUN: 15 mg/dL (ref 6–23)
CHLORIDE: 101 meq/L (ref 96–112)
CO2: 34 mEq/L — ABNORMAL HIGH (ref 19–32)
Calcium: 9.9 mg/dL (ref 8.4–10.5)
Creatinine, Ser: 1.13 mg/dL (ref 0.40–1.50)
GFR: 69.67 mL/min (ref >60.00)
GLUCOSE: 127 mg/dL — AB (ref 70–99)
POTASSIUM: 4.8 meq/L (ref 3.5–5.1)
SODIUM: 141 meq/L (ref 135–145)
Total Bilirubin: 0.7 mg/dL (ref 0.2–1.2)
Total Protein: 6.9 g/dL (ref 6.0–8.3)

## 2018-06-07 LAB — VITAMIN B12: Vitamin B-12: 376 pg/mL (ref 211–911)

## 2018-06-07 LAB — VITAMIN D 25 HYDROXY (VIT D DEFICIENCY, FRACTURES): VITD: 26.89 ng/mL — AB (ref 30.00–100.00)

## 2018-06-07 LAB — TSH: TSH: 1.66 u[IU]/mL (ref 0.35–4.50)

## 2018-06-07 MED ORDER — VITAMIN D (ERGOCALCIFEROL) 1.25 MG (50000 UNIT) PO CAPS: 50000.0000 [IU] | ORAL_CAPSULE | ORAL | 1 refills | Status: DC

## 2018-06-07 MED ORDER — VITAMIN D (ERGOCALCIFEROL) 1.25 MG (50000 UNIT) PO CAPS: 50000.0000 [IU] | ORAL_CAPSULE | ORAL | 3 refills | Status: DC

## 2018-06-07 MED ORDER — TRAZODONE HCL 150 MG PO TABS: 150.0000 mg | ORAL_TABLET | Freq: Every day | ORAL | 1 refills | Status: DC

## 2018-06-07 MED ORDER — METOPROLOL TARTRATE 50 MG PO TABS: 50.0000 mg | ORAL_TABLET | Freq: Two times a day (BID) | ORAL | 1 refills | Status: DC

## 2018-06-07 MED ORDER — AMLODIPINE BESYLATE 5 MG PO TABS: 5.0000 mg | ORAL_TABLET | Freq: Every day | ORAL | 1 refills | Status: DC

## 2018-06-07 MED ORDER — OMEPRAZOLE 20 MG PO CPDR: 20.0000 mg | DELAYED_RELEASE_CAPSULE | Freq: Every day | ORAL | 1 refills | Status: DC

## 2018-06-07 MED ORDER — LOSARTAN POTASSIUM 100 MG PO TABS: 100.0000 mg | ORAL_TABLET | Freq: Every day | ORAL | 1 refills | Status: DC

## 2018-06-07 NOTE — Addendum Note (Signed)
Addended by: Lelon Frohlich Y on: 06/07/2018 12:57 PM   Modules accepted: Orders

## 2018-06-07 NOTE — Progress Notes (Addendum)
Established Patient Office Visit     CC/Reason for Visit: To establish care, medication refills and follow-up on chronic medical conditions  HPI: Thomas Baldwin is a 62 y.o. male who is coming in today for the above mentioned reasons.  Past due for annual physical, last performed in August 2018.  Past Medical History is significant for: Hypertension, GERD, paroxysmal atrial fibrillation.  He has no acute complaints at today's visit.  He is somewhat emotional due to his wife's diagnosis of stage IV lung cancer.  He states she is doing relatively well in a clinical trial through [facility].  They are in fact getting ready to go to [place] for 2 weeks tomorrow.  He needs medication refills.  He ran out of his losartan 2 days ago.  His GERD remains stable and asymptomatic.  He does have a history of paroxysmal atrial fibrillation, has not been anticoagulated.   Past Medical/Surgical History: Past Medical History:  Diagnosis Date  . Atypical chest pain   . GERD (gastroesophageal reflux disease)    Questionable  . Hypertension     Past Surgical History:  Procedure Laterality Date  . TONSILECTOMY, ADENOIDECTOMY, BILATERAL MYRINGOTOMY AND TUBES      Social History:  reports that he has never smoked. He has never used smokeless tobacco. He reports that he drinks alcohol. His drug history is not on file.  Allergies: No Known Allergies  Family History:  Family History  Problem Relation Age of Onset  . Coronary artery disease Unknown   . Diabetes Unknown   . Hypertension Unknown      Current Outpatient Medications:  .  amLODipine (NORVASC) 5 MG tablet, Take 1 tablet (5 mg total) by mouth daily., Disp: 90 tablet, Rfl: 1 .  aspirin 81 MG tablet, Take 81 mg by mouth daily., Disp: , Rfl:  .  losartan (COZAAR) 100 MG tablet, Take 1 tablet (100 mg total) by mouth daily., Disp: 90 tablet, Rfl: 1 .  metoprolol tartrate (LOPRESSOR) 50 MG tablet, Take 1 tablet (50 mg total) by mouth  2 (two) times daily., Disp: 180 tablet, Rfl: 1 .  omeprazole (PRILOSEC) 20 MG capsule, Take 1 capsule (20 mg total) by mouth daily., Disp: 90 capsule, Rfl: 1 .  traZODone (DESYREL) 150 MG tablet, Take 1 tablet (150 mg total) by mouth at bedtime., Disp: 90 tablet, Rfl: 1  Review of Systems:  Constitutional: Denies fever, chills, diaphoresis, appetite change and fatigue.  HEENT: Denies photophobia, eye pain, redness, hearing loss, ear pain, congestion, sore throat, rhinorrhea, sneezing, mouth sores, trouble swallowing, neck pain, neck stiffness and tinnitus.   Respiratory: Denies SOB, DOE, cough, chest tightness,  and wheezing.   Cardiovascular: Denies chest pain, palpitations and leg swelling.  Gastrointestinal: Denies nausea, vomiting, abdominal pain, diarrhea, constipation, blood in stool and abdominal distention.  Genitourinary: Denies dysuria, urgency, frequency, hematuria, flank pain and difficulty urinating.  Endocrine: Denies: hot or cold intolerance, sweats, changes in hair or nails, polyuria, polydipsia. Musculoskeletal: Denies myalgias, back pain, joint swelling, arthralgias and gait problem.  Skin: Denies pallor, rash and wound.  Neurological: Denies dizziness, seizures, syncope, weakness, light-headedness, numbness and headaches.  Hematological: Denies adenopathy. Easy bruising, personal or family bleeding history  Psychiatric/Behavioral: Denies suicidal ideation, mood changes, confusion, nervousness, sleep disturbance and agitation    Physical Exam: Vitals:   06/07/18 0756  BP: 130/90  Pulse: 69  Temp: 97.9 F (36.6 C)  TempSrc: Oral  SpO2: 96%  Weight: 249 lb 1.6 oz (  113 kg)  Height: 6\' 6"  (1.981 m)    Body mass index is 28.79 kg/m.   Constitutional: NAD, calm, comfortable Eyes: PERRL, lids and conjunctivae normal ENMT: Mucous membranes are moist. Posterior pharynx clear of any exudate or lesions. Normal dentition. Tympanic membrane is pearly white, no erythema or  bulging. Neck: normal, supple, no masses, no thyromegaly Respiratory: clear to auscultation bilaterally, no wheezing, no crackles. Normal respiratory effort. No accessory muscle use.  Cardiovascular: Regular rate and rhythm, no murmurs / rubs / gallops. No extremity edema. 2+ pedal pulses. No carotid bruits.  Musculoskeletal: no clubbing / cyanosis. No joint deformity upper and lower extremities. Good ROM, no contractures. Normal muscle tone.  Skin: no rashes, lesions, ulcers. No induration Neurologic: Grossly intact and nonfocal Psychiatric: Normal judgment and insight. Alert and oriented x 3. Normal mood.    Impression and Plan:  Essential hypertension - Plan: amLODipine (NORVASC) 5 MG tablet, losartan (COZAAR) 100 MG tablet, metoprolol tartrate (LOPRESSOR) 50 MG tablet, omeprazole (PRILOSEC) 20 MG capsule, traZODone (DESYREL) 150 MG tablet, DISCONTINUED: amLODipine (NORVASC) 5 MG tablet, DISCONTINUED: losartan (COZAAR) 100 MG tablet, DISCONTINUED: metoprolol tartrate (LOPRESSOR) 50 MG tablet, DISCONTINUED: omeprazole (PRILOSEC) 20 MG capsule, DISCONTINUED: traZODone (DESYREL) 150 MG tablet  Paroxysmal atrial fibrillation (HCC), Chronic  Impaired glucose tolerance  Encounter for preventive health examination - Plan: CBC with Differential/Platelet, Comprehensive metabolic panel, TSH, Vitamin B12, VITAMIN D 25 Hydroxy (Vit-D Deficiency, Fractures), Lipid panel  Chronic diastolic CHF (congestive heart failure) (HCC)  -Blood pressure remains elevated today at 130/90, he has been out of his losartan for 2 days, and additionally is going through a lot of anxiety due to his wife's cancer diagnosis. -Have refilled blood pressure medications and requested he return in 3 months for blood pressure check and his annual physical.  -Have discussed at length his paroxysmal atrial fibrillation.  His CHADSVASC score is 2 given his hypertension and congestive heart failure (echo from 2013 with ejection  fraction of 45% and grade 2 diastolic dysfunction).  This correlates to a 2.9% stroke risk per year.  Have discussed with him that I would recommend anticoagulation for him.  He does not want to make any changes today before his trip, but will give this some serious consideration.  Have offered referral to cardiology if he prefers, he would also like to sit on that today.  Have recommended that he continue daily aspirin in the meantime.  -In regards to his chronic diastolic heart failure this is compensated, he remains on beta-blocker, ARB and statin.  -He states he will make follow-up appointment in the next 3 months for annual physical.  Will obtain CMP, CBC with differential, TSH, B12, lipid, vitamin D, HIV antibody, hepatitis C antibody in preparation for his physical.  He is fasting today.   Addendum: -Vit D level is 26. Will send Rx for Vit D 50,000 units weekly for 12 months, at which point will need Vit D levels rechecked.   Patient Instructions  BEFORE YOU LEAVE: -labs.  We will discuss results when you return for your physical.   -Follow up: In 3 to 4 months or sooner for your annual physical.      Lelon Frohlich, MD [facility]

## 2018-06-07 NOTE — Patient Instructions (Addendum)
BEFORE YOU LEAVE: -labs.  We will discuss results when you return for your physical.   -Follow up: In 3 to 4 months or sooner for your annual physical.

## 2018-06-08 LAB — HEPATITIS C ANTIBODY
Hepatitis C Ab: NONREACTIVE
SIGNAL TO CUT-OFF: 0.01 (ref <1.00)

## 2018-06-08 LAB — HIV ANTIBODY (ROUTINE TESTING W REFLEX): HIV 1&2 Ab, 4th Generation: NONREACTIVE

## 2018-07-19 ENCOUNTER — Other Ambulatory Visit: Payer: Self-pay | Admitting: Internal Medicine

## 2018-08-25 ENCOUNTER — Telehealth: Payer: Self-pay | Admitting: Internal Medicine

## 2018-08-25 NOTE — Telephone Encounter (Signed)
Patient had blood in his urine two times last night.  No blood since then.  Patient is requesting a call back to ask a question about this.  [phone]- call this number first

## 2018-08-25 NOTE — Telephone Encounter (Signed)
Spoke with patient and he has had no sign of hematuria today.  He has no pain or fever.  Appointment was offered, but patient will wait until is appointment on 09/01/2018.  Patient is aware that he should schedule an appointment if any symptoms return.  FYI

## 2018-09-01 ENCOUNTER — Ambulatory Visit (INDEPENDENT_AMBULATORY_CARE_PROVIDER_SITE_OTHER): Payer: BLUE CROSS/BLUE SHIELD | Admitting: Internal Medicine

## 2018-09-01 ENCOUNTER — Encounter: Payer: Self-pay | Admitting: Internal Medicine

## 2018-09-01 VITALS — BP 130/80 | HR 83 | Temp 97.7°F | Ht 78.0 in | Wt 243.6 lb

## 2018-09-01 DIAGNOSIS — I480 Paroxysmal atrial fibrillation: Secondary | ICD-10-CM | POA: Diagnosis not present

## 2018-09-01 DIAGNOSIS — Z23 Encounter for immunization: Secondary | ICD-10-CM

## 2018-09-01 DIAGNOSIS — I10 Essential (primary) hypertension: Secondary | ICD-10-CM

## 2018-09-01 DIAGNOSIS — R7302 Impaired glucose tolerance (oral): Secondary | ICD-10-CM | POA: Diagnosis not present

## 2018-09-01 DIAGNOSIS — D229 Melanocytic nevi, unspecified: Secondary | ICD-10-CM

## 2018-09-01 DIAGNOSIS — Z0000 Encounter for general adult medical examination without abnormal findings: Secondary | ICD-10-CM

## 2018-09-01 LAB — CBC WITH DIFFERENTIAL/PLATELET
Basophils Absolute: 0 10*3/uL (ref 0.0–0.1)
Basophils Relative: 0.3 % (ref 0.0–3.0)
EOS ABS: 0.3 10*3/uL (ref 0.0–0.7)
Eosinophils Relative: 4.6 % (ref 0.0–5.0)
HCT: 43.7 % (ref 39.0–52.0)
Hemoglobin: 15.1 g/dL (ref 13.0–17.0)
Lymphocytes Relative: 11.9 % — ABNORMAL LOW (ref 12.0–46.0)
Lymphs Abs: 0.9 10*3/uL (ref 0.7–4.0)
MCHC: 34.7 g/dL (ref 30.0–36.0)
MCV: 94.1 fl (ref 78.0–100.0)
MONO ABS: 0.7 10*3/uL (ref 0.1–1.0)
Monocytes Relative: 10 % (ref 3.0–12.0)
NEUTROS PCT: 73.2 % (ref 43.0–77.0)
Neutro Abs: 5.4 10*3/uL (ref 1.4–7.7)
Platelets: 201 10*3/uL (ref 150.0–400.0)
RBC: 4.64 Mil/uL (ref 4.22–5.81)
RDW: 12.7 % (ref 11.5–15.5)
WBC: 7.4 10*3/uL (ref 4.0–10.5)

## 2018-09-01 LAB — LIPID PANEL
Cholesterol: 155 mg/dL (ref 0–200)
HDL: 36 mg/dL — ABNORMAL LOW (ref >39.00)
LDL Cholesterol: 85 mg/dL (ref 0–99)
NonHDL: 119.45
Total CHOL/HDL Ratio: 4
Triglycerides: 171 mg/dL — ABNORMAL HIGH (ref 0.0–149.0)
VLDL: 34.2 mg/dL (ref 0.0–40.0)

## 2018-09-01 LAB — COMPREHENSIVE METABOLIC PANEL
ALT: 23 U/L (ref 0–53)
AST: 22 U/L (ref 0–37)
Albumin: 4.5 g/dL (ref 3.5–5.2)
Alkaline Phosphatase: 68 U/L (ref 39–117)
BUN: 15 mg/dL (ref 6–23)
CO2: 34 mEq/L — ABNORMAL HIGH (ref 19–32)
Calcium: 9.7 mg/dL (ref 8.4–10.5)
Chloride: 101 mEq/L (ref 96–112)
Creatinine, Ser: 1.09 mg/dL (ref 0.40–1.50)
GFR: 68.29 mL/min (ref >60.00)
GLUCOSE: 93 mg/dL (ref 70–99)
Potassium: 4.5 mEq/L (ref 3.5–5.1)
Sodium: 142 mEq/L (ref 135–145)
Total Bilirubin: 0.8 mg/dL (ref 0.2–1.2)
Total Protein: 7.2 g/dL (ref 6.0–8.3)

## 2018-09-01 LAB — HEMOGLOBIN A1C: Hgb A1c MFr Bld: 5.4 % (ref 4.6–6.5)

## 2018-09-01 LAB — VITAMIN B12: Vitamin B-12: 471 pg/mL (ref 211–911)

## 2018-09-01 LAB — TSH: TSH: 1.27 u[IU]/mL (ref 0.35–4.50)

## 2018-09-01 LAB — VITAMIN D 25 HYDROXY (VIT D DEFICIENCY, FRACTURES): VITD: 33.19 ng/mL (ref 30.00–100.00)

## 2018-09-01 NOTE — Addendum Note (Signed)
Addended by: Westley Hummer B on: 09/01/2018 10:03 AM   Modules accepted: Orders

## 2018-09-01 NOTE — Patient Instructions (Signed)
-Nice seeing you today!  -1/2 shingles vaccines today.  -Make sure you schedule your dental exam.  -Will initiate referrals to dermatology and GI.  -Schedule follow up in 4 months.  -Remember to give some thought to starting blood thinners.   Preventive Care 40-64 Years, Male Preventive care refers to lifestyle choices and visits with your health care provider that can promote health and wellness. What does preventive care include?   A yearly physical exam. This is also called an annual well check.  Dental exams once or twice a year.  Routine eye exams. Ask your health care provider how often you should have your eyes checked.  Personal lifestyle choices, including: ? Daily care of your teeth and gums. ? Regular physical activity. ? Eating a healthy diet. ? Avoiding tobacco and drug use. ? Limiting alcohol use. ? Practicing safe sex. ? Taking low-dose aspirin every day starting at age 31. What happens during an annual well check? The services and screenings done by your health care provider during your annual well check will depend on your age, overall health, lifestyle risk factors, and family history of disease. Counseling Your health care provider may ask you questions about your:  Alcohol use.  Tobacco use.  Drug use.  Emotional well-being.  Home and relationship well-being.  Sexual activity.  Eating habits.  Work and work Statistician. Screening You may have the following tests or measurements:  Height, weight, and BMI.  Blood pressure.  Lipid and cholesterol levels. These may be checked every 5 years, or more frequently if you are over 50 years old.  Skin check.  Lung cancer screening. You may have this screening every year starting at age 13 if you have a 30-pack-year history of smoking and currently smoke or have quit within the past 15 years.  Colorectal cancer screening. All adults should have this screening starting at age 69 and continuing  until age 41. Your health care provider may recommend screening at age 66. You will have tests every 1-10 years, depending on your results and the type of screening test. People at increased risk should start screening at an earlier age. Screening tests may include: ? Guaiac-based fecal occult blood testing. ? Fecal immunochemical test (FIT). ? Stool DNA test. ? Virtual colonoscopy. ? Sigmoidoscopy. During this test, a flexible tube with a tiny camera (sigmoidoscope) is used to examine your rectum and lower colon. The sigmoidoscope is inserted through your anus into your rectum and lower colon. ? Colonoscopy. During this test, a long, thin, flexible tube with a tiny camera (colonoscope) is used to examine your entire colon and rectum.  Prostate cancer screening. Recommendations will vary depending on your family history and other risks.  Hepatitis C blood test.  Hepatitis B blood test.  Sexually transmitted disease (STD) testing.  Diabetes screening. This is done by checking your blood sugar (glucose) after you have not eaten for a while (fasting). You may have this done every 1-3 years. Discuss your test results, treatment options, and if necessary, the need for more tests with your health care provider. Vaccines Your health care provider may recommend certain vaccines, such as:  Influenza vaccine. This is recommended every year.  Tetanus, diphtheria, and acellular pertussis (Tdap, Td) vaccine. You may need a Td booster every 10 years.  Varicella vaccine. You may need this if you have not been vaccinated.  Zoster vaccine. You may need this after age 28.  Measles, mumps, and rubella (MMR) vaccine. You may need at least  one dose of MMR if you were born in 1957 or later. You may also need a second dose.  Pneumococcal 13-valent conjugate (PCV13) vaccine. You may need this if you have certain conditions and have not been vaccinated.  Pneumococcal polysaccharide (PPSV23) vaccine. You may  need one or two doses if you smoke cigarettes or if you have certain conditions.  Meningococcal vaccine. You may need this if you have certain conditions.  Hepatitis A vaccine. You may need this if you have certain conditions or if you travel or work in places where you may be exposed to hepatitis A.  Hepatitis B vaccine. You may need this if you have certain conditions or if you travel or work in places where you may be exposed to hepatitis B.  Haemophilus influenzae type b (Hib) vaccine. You may need this if you have certain risk factors. Talk to your health care provider about which screenings and vaccines you need and how often you need them. This information is not intended to replace advice given to you by your health care provider. Make sure you discuss any questions you have with your health care provider. Document Released: 07/26/2015 Document Revised: 08/19/2017 Document Reviewed: 04/30/2015 Elsevier Interactive Patient Education  2019 [employer].

## 2018-09-01 NOTE — Progress Notes (Signed)
Established Patient Office Visit     CC/Reason for Visit: CPE  HPI: Thomas Baldwin is a 63 y.o. male who is coming in today for the above mentioned reasons. Past Medical History is significant for: Hypertension, GERD, paroxysmal atrial fibrillation. No acute complaints. He still hasn't made a decision about starting anticoagulation for his a fib. He has been complaint with his medications.  Interested in starting shingles vaccination series today. Due for colonoscopy for cancer screening. Has routine eye care, will schedule dental exam soon. Would like referral to dermatology for numerous skin moles.  His wife has Stage IV lung cancer, she is doing relatively well in a clinical trial thru [facility] a cigar occasional, no cigarettes. No ETOH use.  Past Medical/Surgical History: Past Medical History:  Diagnosis Date  . Atypical chest pain   . GERD (gastroesophageal reflux disease)    Questionable  . Hypertension     Past Surgical History:  Procedure Laterality Date  . TONSILECTOMY, ADENOIDECTOMY, BILATERAL MYRINGOTOMY AND TUBES      Social History:  reports that he has never smoked. He has never used smokeless tobacco. He reports current alcohol use. No history on file for drug.  Allergies: No Known Allergies  Family History:  Family History  Problem Relation Age of Onset  . Coronary artery disease Unknown   . Diabetes Unknown   . Hypertension Unknown      Current Outpatient Medications:  .  amLODipine (NORVASC) 5 MG tablet, Take 1 tablet (5 mg total) by mouth daily., Disp: 90 tablet, Rfl: 1 .  aspirin 81 MG tablet, Take 81 mg by mouth daily., Disp: , Rfl:  .  losartan (COZAAR) 100 MG tablet, Take 1 tablet (100 mg total) by mouth daily., Disp: 90 tablet, Rfl: 1 .  metoprolol tartrate (LOPRESSOR) 50 MG tablet, Take 1 tablet (50 mg total) by mouth 2 (two) times daily., Disp: 180 tablet, Rfl: 1 .  omeprazole (PRILOSEC) 20 MG capsule, Take 1 capsule (20 mg  total) by mouth daily., Disp: 90 capsule, Rfl: 1 .  traZODone (DESYREL) 150 MG tablet, Take 1 tablet (150 mg total) by mouth at bedtime., Disp: 90 tablet, Rfl: 1 .  Vitamin D, Ergocalciferol, (DRISDOL) 1.25 MG (50000 UT) CAPS capsule, Take 1 capsule (50,000 Units total) by mouth every 7 (seven) days., Disp: 12 capsule, Rfl: 1  Review of Systems:  Constitutional: Denies fever, chills, diaphoresis, appetite change and fatigue.  HEENT: Denies photophobia, eye pain, redness, hearing loss, ear pain, congestion, sore throat, rhinorrhea, sneezing, mouth sores, trouble swallowing, neck pain, neck stiffness and tinnitus.   Respiratory: Denies SOB, DOE, cough, chest tightness,  and wheezing.   Cardiovascular: Denies chest pain, palpitations and leg swelling.  Gastrointestinal: Denies nausea, vomiting, abdominal pain, diarrhea, constipation, blood in stool and abdominal distention.  Genitourinary: Denies dysuria, urgency, frequency, hematuria, flank pain and difficulty urinating.  Endocrine: Denies: hot or cold intolerance, sweats, changes in hair or nails, polyuria, polydipsia. Musculoskeletal: Denies myalgias, back pain, joint swelling, arthralgias and gait problem.  Skin: Denies pallor, rash and wound.  Neurological: Denies dizziness, seizures, syncope, weakness, light-headedness, numbness and headaches.  Hematological: Denies adenopathy. Easy bruising, personal or family bleeding history  Psychiatric/Behavioral: Denies suicidal ideation, mood changes, confusion, nervousness, sleep disturbance and agitation    Physical Exam: Vitals:   09/01/18 0852  BP: 130/80  Pulse: 83  Temp: 97.7 F (36.5 C)  TempSrc: Oral  SpO2: 97%  Weight: 243 lb 9.6 oz (110.5 kg)  Height: '6\' 6"'  (1.981 m)    Body mass index is 28.15 kg/m.   Constitutional: NAD, calm, comfortable Eyes: PERRL, lids and conjunctivae normal ENMT: Mucous membranes are moist. Posterior pharynx clear of any exudate or lesions. Normal  dentition. Tympanic membrane is pearly white, no erythema or bulging. Neck: normal, supple, no masses, no thyromegaly, no carotid bruits. Respiratory: clear to auscultation bilaterally, no wheezing, no crackles. Normal respiratory effort. No accessory muscle use.  Cardiovascular: Regular rate and rhythm, no murmurs / rubs / gallops. No extremity edema. 2+ pedal pulses. No carotid bruits.  Abdomen: no tenderness, no masses palpated. No hepatosplenomegaly. Bowel sounds positive.  Musculoskeletal: no clubbing / cyanosis. No joint deformity upper and lower extremities. Good ROM, no contractures. Normal muscle tone.  Skin: no rashes, lesions, ulcers. No induration Neurologic: CN 2-12 grossly intact. Sensation intact, DTR normal. Strength 5/5 in all 4.  Psychiatric: Normal judgment and insight. Alert and oriented x 3. Normal mood.    Impression and Plan:  Encounter for preventive health examination  -Lab work today. -Advised routine dental care, has eye care. -Will start shingles series today. -GI referral for screening colonoscopy.  Essential hypertension  -Well controlled on current meds.  Paroxysmal atrial fibrillation (HCC)  -His CHADSVASC score is 2 given his hypertension and congestive heart failure (echo from 2013 with ejection fraction of 45% and grade 2 diastolic dysfunction). Maybe even a bit higher as he has IGT altho no frank DM.  This correlates to a 2.2% stroke risk per year.  Have discussed with him that I would recommend anticoagulation for him. He is still undecided. I have offered referral to cardiology to further discuss. He prefers to wait.  Impaired glucose tolerance  -Check A1c today. -Counseled on lifestyle modifications.  Numerous moles - Plan: Ambulatory referral to Dermatology    Patient Instructions  -Nice seeing you today!  -1/2 shingles vaccines today.  -Make sure you schedule your dental exam.  -Will initiate referrals to dermatology and  GI.  -Schedule follow up in 4 months.  -Remember to give some thought to starting blood thinners.   Preventive Care 40-64 Years, Male Preventive care refers to lifestyle choices and visits with your health care provider that can promote health and wellness. What does preventive care include?   A yearly physical exam. This is also called an annual well check.  Dental exams once or twice a year.  Routine eye exams. Ask your health care provider how often you should have your eyes checked.  Personal lifestyle choices, including: ? Daily care of your teeth and gums. ? Regular physical activity. ? Eating a healthy diet. ? Avoiding tobacco and drug use. ? Limiting alcohol use. ? Practicing safe sex. ? Taking low-dose aspirin every day starting at age 59. What happens during an annual well check? The services and screenings done by your health care provider during your annual well check will depend on your age, overall health, lifestyle risk factors, and family history of disease. Counseling Your health care provider may ask you questions about your:  Alcohol use.  Tobacco use.  Drug use.  Emotional well-being.  Home and relationship well-being.  Sexual activity.  Eating habits.  Work and work Statistician. Screening You may have the following tests or measurements:  Height, weight, and BMI.  Blood pressure.  Lipid and cholesterol levels. These may be checked every 5 years, or more frequently if you are over 60 years old.  Skin check.  Lung cancer screening. You may  have this screening every year starting at age 26 if you have a 30-pack-year history of smoking and currently smoke or have quit within the past 15 years.  Colorectal cancer screening. All adults should have this screening starting at age 57 and continuing until age 36. Your health care provider may recommend screening at age 73. You will have tests every 1-10 years, depending on your results and the  type of screening test. People at increased risk should start screening at an earlier age. Screening tests may include: ? Guaiac-based fecal occult blood testing. ? Fecal immunochemical test (FIT). ? Stool DNA test. ? Virtual colonoscopy. ? Sigmoidoscopy. During this test, a flexible tube with a tiny camera (sigmoidoscope) is used to examine your rectum and lower colon. The sigmoidoscope is inserted through your anus into your rectum and lower colon. ? Colonoscopy. During this test, a long, thin, flexible tube with a tiny camera (colonoscope) is used to examine your entire colon and rectum.  Prostate cancer screening. Recommendations will vary depending on your family history and other risks.  Hepatitis C blood test.  Hepatitis B blood test.  Sexually transmitted disease (STD) testing.  Diabetes screening. This is done by checking your blood sugar (glucose) after you have not eaten for a while (fasting). You may have this done every 1-3 years. Discuss your test results, treatment options, and if necessary, the need for more tests with your health care provider. Vaccines Your health care provider may recommend certain vaccines, such as:  Influenza vaccine. This is recommended every year.  Tetanus, diphtheria, and acellular pertussis (Tdap, Td) vaccine. You may need a Td booster every 10 years.  Varicella vaccine. You may need this if you have not been vaccinated.  Zoster vaccine. You may need this after age 27.  Measles, mumps, and rubella (MMR) vaccine. You may need at least one dose of MMR if you were born in 1957 or later. You may also need a second dose.  Pneumococcal 13-valent conjugate (PCV13) vaccine. You may need this if you have certain conditions and have not been vaccinated.  Pneumococcal polysaccharide (PPSV23) vaccine. You may need one or two doses if you smoke cigarettes or if you have certain conditions.  Meningococcal vaccine. You may need this if you have certain  conditions.  Hepatitis A vaccine. You may need this if you have certain conditions or if you travel or work in places where you may be exposed to hepatitis A.  Hepatitis B vaccine. You may need this if you have certain conditions or if you travel or work in places where you may be exposed to hepatitis B.  Haemophilus influenzae type b (Hib) vaccine. You may need this if you have certain risk factors. Talk to your health care provider about which screenings and vaccines you need and how often you need them. This information is not intended to replace advice given to you by your health care provider. Make sure you discuss any questions you have with your health care provider. Document Released: 07/26/2015 Document Revised: 08/19/2017 Document Reviewed: 04/30/2015 Elsevier Interactive Patient Education  2019 [place], [place] [facility]

## 2018-10-25 ENCOUNTER — Telehealth: Payer: Self-pay

## 2018-10-25 NOTE — Telephone Encounter (Signed)
----- Message from Daneil Dolin, MD sent at 10/24/2018  2:53 PM EDT ----- Go ahead and set up for a screening TCS - conscious sedation.  Thanks ----- Message ----- From: Claudina Lick, LPN Sent: 03/06/538  12:17 PM EDT To: Daneil Dolin, MD  Current Medications:    amLODipine (NORVASC) 5 MG tablet, Take 1 tablet (5 mg total) by mouth daily   aspirin 81 MG tablet, Take 81 mg by mouth daily   losartan (COZAAR) 100 MG tablet, Take 1 tablet (100 mg total) by mouth daily   metoprolol tartrate (LOPRESSOR) 50 MG tablet, Take 1 tablet (50 mg total) by mouth 2 (two) times daily   omeprazole (PRILOSEC) 20 MG capsule, Take 1 capsule (20 mg total) by mouth daily   traZODone (DESYREL) 150 MG tablet, Take 1 tablet (150 mg total) by mouth at bedtime   Vitamin D, Ergocalciferol, (DRISDOL) 1.25 MG (50000 UT) CAPS capsule  Procedure note:   NAMEJATAVIAN, CALICA                 ACCOUNT NO.:  [id number]   MEDICAL RECORD NO.:  [id number]          PATIENT TYPE:  AMB   LOCATION:  DAY                           FACILITY:  [facility]   PHYSICIAN:  R. Garfield Cornea, M.D. DATE OF BIRTH:  Mar 31, 1956   DATE OF PROCEDURE:  01/21/2007  DATE OF DISCHARGE:                               OPERATIVE REPORT   PROCEDURE:  Screening colonoscopy.   INDICATIONS FOR PROCEDURE:  Patient is 63 year old Caucasian male with  no lower GI tract symptoms sent over courtesy Dr. Burnice Logan, [facility]  family practice [facility] for colorectal cancer screening via  colonoscopy.  Mr. Davis has never his lower GI tract imaged.  There was  no family history of colorectal neoplasia.  Colonoscopy is now being  done as a screening maneuver.  This approach has discussed with the  patient at length.  Potential risks, benefits and alternatives have been  reviewed, questions answered, he is agreeable.  Please see documentation  in the medical record.   PROCEDURE NOTE:  O2 saturation, blood pressure, pulse respirations were  monitored throughout the entire procedure.  Conscious sedation Versed 3  mg IV Demerol 75 mg IV in divided doses.   INSTRUMENT:  Pentax video chip system.   FINDINGS:  Digital rectal exam revealed no abnormalities.   ENDOSCOPIC FINDINGS:  The prep was adequate.  Colon:  Colonic mucosa was surveyed from rectosigmoid junction to the  left, transverse, right colon to the appendiceal orifice, ileocecal  valve and cecum.  These structures well seen and photographed for the  record.  From this level scope was slowly cautiously withdrawn.  All  previously mentioned mucosal surfaces were again seen.  The colonic  mucosa appeared normal.  Scope was pulled down in the rectum.  Thorough  examination of rectal mucosa including retroflex view of the anal verge  demonstrated no abnormalities as well.  The patient tolerated the  procedure well was reacted in endoscopy.   IMPRESSION:  1. Normal rectum.  2. Normal colon.   RECOMMENDATIONS:  Consider repeat screening colonoscopy 10 years.  Bridgette Habermann, M.D. Electronically Signed  RMR/MEDQ  D:  01/21/2007  T:  01/22/2007  Job:  [id number]   cc:   Marletta Lor, MD ----- Message ----- From: Daneil Dolin, MD Sent: 10/24/2018  12:01 PM EDT To: Claudina Lick, LPN   I dont see old report; how did he get done last time? WhAT are current meds? ----- Message ----- From: Claudina Lick, LPN Sent: 07/23/347  10:19 AM EDT To: Daneil Dolin, MD  Dr.Rourk,  We received a referral for a screening tcs on this pt. You did his last one in 2008 and it was normal. He currently has afib but is not on any anticoagulation and he is on trazodone qhs. Does he need to be done with propofol? Marland Kitchen

## 2018-11-29 ENCOUNTER — Encounter: Payer: Self-pay | Admitting: Internal Medicine

## 2018-11-30 ENCOUNTER — Other Ambulatory Visit: Payer: Self-pay

## 2018-11-30 ENCOUNTER — Ambulatory Visit (INDEPENDENT_AMBULATORY_CARE_PROVIDER_SITE_OTHER): Payer: BLUE CROSS/BLUE SHIELD | Admitting: *Deleted

## 2018-11-30 DIAGNOSIS — Z23 Encounter for immunization: Secondary | ICD-10-CM | POA: Diagnosis not present

## 2018-12-12 ENCOUNTER — Encounter: Payer: Self-pay | Admitting: *Deleted

## 2018-12-19 ENCOUNTER — Other Ambulatory Visit: Payer: Self-pay | Admitting: Internal Medicine

## 2018-12-19 DIAGNOSIS — I10 Essential (primary) hypertension: Secondary | ICD-10-CM

## 2018-12-22 ENCOUNTER — Other Ambulatory Visit: Payer: Self-pay | Admitting: Internal Medicine

## 2018-12-22 DIAGNOSIS — I10 Essential (primary) hypertension: Secondary | ICD-10-CM

## 2018-12-27 DIAGNOSIS — H43812 Vitreous degeneration, left eye: Secondary | ICD-10-CM | POA: Diagnosis not present

## 2018-12-27 DIAGNOSIS — Z961 Presence of intraocular lens: Secondary | ICD-10-CM | POA: Diagnosis not present

## 2019-01-05 ENCOUNTER — Ambulatory Visit: Payer: BLUE CROSS/BLUE SHIELD

## 2019-01-05 ENCOUNTER — Encounter

## 2019-03-09 ENCOUNTER — Other Ambulatory Visit: Payer: Self-pay | Admitting: Internal Medicine

## 2019-03-09 DIAGNOSIS — I10 Essential (primary) hypertension: Secondary | ICD-10-CM

## 2019-03-21 ENCOUNTER — Other Ambulatory Visit: Payer: Self-pay | Admitting: Internal Medicine

## 2019-03-21 DIAGNOSIS — I10 Essential (primary) hypertension: Secondary | ICD-10-CM

## 2019-03-22 DIAGNOSIS — H35371 Puckering of macula, right eye: Secondary | ICD-10-CM | POA: Diagnosis not present

## 2019-03-22 DIAGNOSIS — H2512 Age-related nuclear cataract, left eye: Secondary | ICD-10-CM | POA: Diagnosis not present

## 2019-03-22 DIAGNOSIS — H33301 Unspecified retinal break, right eye: Secondary | ICD-10-CM | POA: Diagnosis not present

## 2019-03-22 DIAGNOSIS — H43811 Vitreous degeneration, right eye: Secondary | ICD-10-CM | POA: Diagnosis not present

## 2019-04-10 ENCOUNTER — Ambulatory Visit: Payer: BLUE CROSS/BLUE SHIELD

## 2019-04-24 ENCOUNTER — Other Ambulatory Visit: Payer: Self-pay

## 2019-04-24 ENCOUNTER — Ambulatory Visit (INDEPENDENT_AMBULATORY_CARE_PROVIDER_SITE_OTHER): Payer: Self-pay | Admitting: *Deleted

## 2019-04-24 DIAGNOSIS — Z1211 Encounter for screening for malignant neoplasm of colon: Secondary | ICD-10-CM

## 2019-04-24 MED ORDER — PEG 3350-KCL-NA BICARB-NACL 420 G PO SOLR: 4000.0000 mL | Freq: Once | ORAL | 0 refills | Status: AC

## 2019-04-24 NOTE — Patient Instructions (Signed)
Thomas Baldwin   1955-10-17 MRN: [id number]    Procedure Date: 06/14/2019 Time to register: 7:30 am Place to register: [facility] Procedure Time: 8:30 am Scheduled provider: Dr. Gala Romney  PREPARATION FOR COLONOSCOPY WITH TRI-LYTE SPLIT PREP  Please notify us immediately if you are diabetic, take iron supplements, or if you are on Coumadin or any other blood thinners.   You will need to purchase 1 fleet enema and 1 box of Bisacodyl '5mg'$  tablets.   2 DAYS BEFORE PROCEDURE:  DATE: 06/12/2019  DAY: Monday Begin clear liquid diet AFTER your lunch meal. NO SOLID FOODS after this point.  1 DAY BEFORE PROCEDURE:  DATE: 06/13/2019   DAY: Tuesday Continue clear liquids the entire day - NO SOLID FOOD.    At 2:00 pm:  Take 2 Bisacodyl tablets.   At 4:00pm:  Start drinking your solution. Make sure you mix well per instructions on the bottle. Try to drink 1 (one) 8 ounce glass every 10-15 minutes until you have consumed HALF the jug. You should complete by 6:00pm.You must keep the left over solution refrigerated until completed next day.  Continue clear liquids. You must drink plenty of clear liquids to prevent dehyration and kidney failure.     DAY OF PROCEDURE:   DATE: 06/14/2019   DAY: Wednesday If you take medications for your heart, blood pressure or breathing, you may take these medications.    Five hours before your procedure time @ 3:30 am:  Finish remaining amout of bowel prep, drinking 1 (one) 8 ounce glass every 10-15 minutes until complete. You have two hours to consume remaining prep.   Three hours before your procedure time @ 5:30 am:  Nothing by mouth.   At least one hour before going to the hospital:  Give yourself one Fleet enema. You may take your morning medications with sip of water unless we have instructed otherwise.      Please see below for Dietary Information.  CLEAR LIQUIDS INCLUDE:  Water Jello (NOT red in color)   Ice Popsicles (NOT red in color)    Tea (sugar ok, no milk/cream) Powdered fruit flavored drinks  Coffee (sugar ok, no milk/cream) Gatorade/ Lemonade/ Kool-Aid  (NOT red in color)   Juice: apple, white grape, white cranberry Soft drinks  Clear bullion, consomme, broth (fat free beef/chicken/vegetable)  Carbonated beverages (any kind)  Strained chicken noodle soup Hard Candy   Remember: Clear liquids are liquids that will allow you to see your fingers on the other side of a clear glass. Be sure liquids are NOT red in color, and not cloudy, but CLEAR.  DO NOT EAT OR DRINK ANY OF THE FOLLOWING:  Dairy products of any kind   Cranberry juice Tomato juice / V8 juice   Grapefruit juice Orange juice     Red grape juice  Do not eat any solid foods, including such foods as: cereal, oatmeal, yogurt, fruits, vegetables, creamed soups, eggs, bread, crackers, pureed foods in a blender, etc.   HELPFUL HINTS FOR DRINKING PREP SOLUTION:   Make sure prep is extremely cold. Mix and refrigerate the the morning of the prep. You may also put in the freezer.   You may try mixing some Crystal Light or Country Time Lemonade if you prefer. Mix in small amounts; add more if necessary.  Try drinking through a straw  Rinse mouth with water or a mouthwash between glasses, to remove after-taste.  Try sipping on a cold beverage /ice/ popsicles between glasses  of prep.  Place a piece of sugar-free hard candy in mouth between glasses.  If you become nauseated, try consuming smaller amounts, or stretch out the time between glasses. Stop for 30-60 minutes, then slowly start back drinking.        OTHER INSTRUCTIONS  You will need a responsible adult at least 64 years of age to accompany you and drive you home. This person must remain in the waiting room during your procedure. The hospital will cancel your procedure if you do not have a responsible adult with you.   1. Wear loose fitting clothing that is easily removed. 2. Leave jewelry and  other valuables at home.  3. Remove all body piercing jewelry and leave at home. 4. Total time from sign-in until discharge is approximately 2-3 hours. 5. You should go home directly after your procedure and rest. You can resume normal activities the day after your procedure. 6. The day of your procedure you should not:  Drive  Make legal decisions  Operate machinery  Drink alcohol  Return to work   You may call the office (Dept: [phone]) before 5:00pm, or page the doctor on call [phone]) after 5:00pm, for further instructions, if necessary.   Insurance Information YOU WILL NEED TO CHECK WITH YOUR INSURANCE COMPANY FOR THE BENEFITS OF COVERAGE YOU HAVE FOR THIS PROCEDURE.  UNFORTUNATELY, NOT ALL INSURANCE COMPANIES HAVE BENEFITS TO COVER ALL OR PART OF THESE TYPES OF PROCEDURES.  IT IS YOUR RESPONSIBILITY TO CHECK YOUR BENEFITS, HOWEVER, WE WILL BE GLAD TO ASSIST YOU WITH ANY CODES YOUR INSURANCE COMPANY MAY NEED.    PLEASE NOTE THAT MOST INSURANCE COMPANIES WILL NOT COVER A SCREENING COLONOSCOPY FOR PEOPLE UNDER THE AGE OF 50  IF YOU HAVE BCBS INSURANCE, YOU MAY HAVE BENEFITS FOR A SCREENING COLONOSCOPY BUT IF POLYPS ARE FOUND THE DIAGNOSIS WILL CHANGE AND THEN YOU MAY HAVE A DEDUCTIBLE THAT WILL NEED TO BE MET. SO PLEASE MAKE SURE YOU CHECK YOUR BENEFITS FOR A SCREENING COLONOSCOPY AS WELL AS A DIAGNOSTIC COLONOSCOPY.

## 2019-04-24 NOTE — Progress Notes (Signed)
Gastroenterology Pre-Procedure Review  Request Date: 04/24/2019 Requesting Physician: Dr. Lelon Frohlich, Last TCS 01/21/2007 done by Dr. Gala Romney, no polyps  PATIENT REVIEW QUESTIONS: The patient responded to the following health history questions as indicated:    1. Diabetes Melitis: no 2. Joint replacements in the past 12 months: no 3. Major health problems in the past 3 months: no 4. Has an artificial valve or MVP: no 5. Has a defibrillator: no 6. Has been advised in past to take antibiotics in advance of a procedure like teeth cleaning: no 7. Family history of colon cancer: no  8. Alcohol Use: yes, 12 beers weekly 9. Illicit drug Use: no 10. History of sleep apnea: no  11. History of coronary artery or other vascular stents placed within the last 12 months: no 12. History of any prior anesthesia complications: no 13. There is no height or weight on file to calculate BMI. ht: 6'6 wt: 237 lbs    MEDICATIONS & ALLERGIES:    Patient reports the following regarding taking any blood thinners:   Plavix? no Aspirin? yes Coumadin? no Brilinta? no Xarelto? no Eliquis? no Pradaxa? no Savaysa? no Effient? no  Patient confirms/reports the following medications:  Current Outpatient Medications  Medication Sig Dispense Refill  . amLODipine (NORVASC) 5 MG tablet Take 1 tablet by mouth once daily 90 tablet 1  . aspirin 81 MG tablet Take 81 mg by mouth daily.    Marland Kitchen losartan (COZAAR) 100 MG tablet Take 1 tablet by mouth once daily 90 tablet 1  . metoprolol tartrate (LOPRESSOR) 50 MG tablet Take 1 tablet by mouth twice daily 180 tablet 1  . Multiple Vitamins-Minerals (MULTIVITAMIN ADULT PO) Take by mouth daily.    Marland Kitchen omeprazole (PRILOSEC) 20 MG capsule Take 1 capsule (20 mg total) by mouth daily. 90 capsule 1  . traZODone (DESYREL) 150 MG tablet TAKE 1 TABLET BY MOUTH AT BEDTIME 90 tablet 0   No current facility-administered medications for this visit.     Patient confirms/reports  the following allergies:  No Known Allergies  No orders of the defined types were placed in this encounter.   AUTHORIZATION INFORMATION Primary Insurance: [place] ,  [place] #[id number],  Group #: [id number] Pre-Cert / Auth required:  Pre-Cert / Auth #:  SCHEDULE INFORMATION: Procedure has been scheduled as follows:  Date: 06/14/2019, Time: 8:30 Location: [facility] with Dr. Gala Romney  This Gastroenterology Pre-Precedure Review Form is being routed to the following provider(s): Walden Field, NP

## 2019-04-25 ENCOUNTER — Encounter: Payer: Self-pay | Admitting: *Deleted

## 2019-04-25 NOTE — Progress Notes (Signed)
Will need OV due to meds and ETOH combination for likely propofol/MAC

## 2019-04-25 NOTE — Progress Notes (Signed)
Mailed letter to pt with appointment information and procedure cancellation.   

## 2019-05-23 ENCOUNTER — Other Ambulatory Visit: Payer: Self-pay

## 2019-05-23 ENCOUNTER — Encounter: Payer: Self-pay | Admitting: Nurse Practitioner

## 2019-05-23 ENCOUNTER — Ambulatory Visit (INDEPENDENT_AMBULATORY_CARE_PROVIDER_SITE_OTHER): Payer: BLUE CROSS/BLUE SHIELD | Admitting: Nurse Practitioner

## 2019-05-23 DIAGNOSIS — Z0000 Encounter for general adult medical examination without abnormal findings: Secondary | ICD-10-CM | POA: Diagnosis not present

## 2019-05-23 DIAGNOSIS — Z1211 Encounter for screening for malignant neoplasm of colon: Secondary | ICD-10-CM

## 2019-05-23 MED ORDER — PEG 3350-KCL-NA BICARB-NACL 420 G PO SOLR: 4000.0000 mL | ORAL | 0 refills | Status: DC

## 2019-05-23 NOTE — Progress Notes (Signed)
Primary Care Physician:  Isaac Bliss, Rayford Halsted, MD Primary Gastroenterologist:  Dr. Gala Romney  Chief Complaint  Patient presents with  . Consult    TCS    HPI:   Thomas Baldwin is a 63 y.o. male who presents on referral from primary care for colonoscopy.  Nurse/phone triage was deferred office visit due to medications and alcohol intake likely necessitating augmented sedation.  Colonoscopy 01/21/2007 with no polyps.  Currently due for updated colonoscopy.  Today he states he's doing well overall. Denies abdominal pain, N/V, hematochezia, melena, fever, chills, unintentional weight loss. Denies URI or flu-like symptoms. Denies loss of sense of taste or smell. Denies chest pain, dyspnea, dizziness, lightheadedness, syncope, near syncope. Denies any other upper or lower GI symptoms.  He was concerned he may have shingles. Upon inspection it is not a shingles-type rash. Has dermatology appointment tomorrow.  Past Medical History:  Diagnosis Date  . Atypical chest pain   . GERD (gastroesophageal reflux disease)    Questionable  . Hypertension     Past Surgical History:  Procedure Laterality Date  . TONSILECTOMY, ADENOIDECTOMY, BILATERAL MYRINGOTOMY AND TUBES      Current Outpatient Medications  Medication Sig Dispense Refill  . amLODipine (NORVASC) 5 MG tablet Take 1 tablet by mouth once daily 90 tablet 1  . aspirin 81 MG tablet Take 81 mg by mouth daily.    Marland Kitchen losartan (COZAAR) 100 MG tablet Take 1 tablet by mouth once daily 90 tablet 1  . metoprolol tartrate (LOPRESSOR) 50 MG tablet Take 1 tablet by mouth twice daily 180 tablet 1  . Multiple Vitamins-Minerals (MULTIVITAMIN ADULT PO) Take by mouth daily.    Marland Kitchen omeprazole (PRILOSEC) 20 MG capsule Take 1 capsule (20 mg total) by mouth daily. 90 capsule 1  . traZODone (DESYREL) 150 MG tablet TAKE 1 TABLET BY MOUTH AT BEDTIME 90 tablet 0   No current facility-administered medications for this visit.     Allergies as of  05/23/2019  . (No Known Allergies)    Family History  Problem Relation Age of Onset  . Coronary artery disease Unknown   . Diabetes Unknown   . Hypertension Unknown     Social History   Socioeconomic History  . Marital status: Married    Spouse name: Not on file  . Number of children: Not on file  . Years of education: Not on file  . Highest education level: Not on file  Occupational History  . Not on file  Social Needs  . Financial resource strain: Not on file  . Food insecurity    Worry: Not on file    Inability: Not on file  . Transportation needs    Medical: Not on file    Non-medical: Not on file  Tobacco Use  . Smoking status: Never Smoker  . Smokeless tobacco: Never Used  Substance and Sexual Activity  . Alcohol use: Yes  . Drug use: Not on file  . Sexual activity: Not on file  Lifestyle  . Physical activity    Days per week: Not on file    Minutes per session: Not on file  . Stress: Not on file  Relationships  . Social Herbalist on phone: Not on file    Gets together: Not on file    Attends religious service: Not on file    Active member of club or organization: Not on file    Attends meetings of clubs or organizations: Not on  file    Relationship status: Not on file  . Intimate partner violence    Fear of current or ex partner: Not on file    Emotionally abused: Not on file    Physically abused: Not on file    Forced sexual activity: Not on file  Other Topics Concern  . Not on file  Social History Narrative  . Not on file    Review of Systems: General: Negative for anorexia, weight loss, fever, chills, fatigue, weakness. ENT: Negative for hoarseness, difficulty swallowing. CV: Negative for chest pain, angina, palpitations, peripheral edema.  Respiratory: Negative for dyspnea at rest, cough, sputum, wheezing.  GI: See history of present illness. MS: Negative for joint pain, low back pain.  Derm: Negative for rash or itching.   Endo: Negative for unusual weight change.  Heme: Negative for bruising or bleeding. Allergy: Negative for rash or hives.    Physical Exam: BP (!) 147/91   Pulse 64   Temp (!) 97 F (36.1 C) (Oral)   Ht 6\' 6"  (1.981 m)   Wt 241 lb 3.2 oz (109.4 kg)   BMI 27.87 kg/m  General:   Alert and oriented. Pleasant and cooperative. Well-nourished and well-developed.  Head:  Normocephalic and atraumatic. Eyes:  Without icterus, sclera clear and conjunctiva pink.  Ears:  Normal auditory acuity. Cardiovascular:  S1, S2 present without murmurs appreciated. Normal pulses noted. Extremities without clubbing or edema. Respiratory:  Clear to auscultation bilaterally. No wheezes, rales, or rhonchi. No distress.  Gastrointestinal:  +BS, soft, non-tender and non-distended. No HSM noted. No guarding or rebound. No masses appreciated.  Rectal:  Deferred  Musculoskalatal:  Symmetrical without gross deformities. Normal posture. Skin:  Noted widespread rash of pustules (some of which have a mild crust) on the chest, abdomen, back; none on the arms, neck, face, legs. Neurologic:  Alert and oriented x4;  grossly normal neurologically. Psych:  Alert and cooperative. Normal mood and affect. Heme/Lymph/Immune: No excessive bruising noted.    05/23/2019 3:25 PM   Disclaimer: This note was dictated with voice recognition software. Similar sounding words can inadvertently be transcribed and may not be corrected upon review.

## 2019-05-23 NOTE — Assessment & Plan Note (Signed)
The patient's last colonoscopy was in 2008 without any polyps.  Recommended 10-year repeat exam.  He is currently a couple years overdue.  Denies any overt GI symptoms.  He does have a rash about his trunk and back and has an appointment with dermatology tomorrow.  It does not look infectious.  At this point we will proceed with scheduling his colonoscopy.  Proceed with TCS on propofol/MAC with Dr. Gala Romney in near future: the risks, benefits, and alternatives have been discussed with the patient in detail. The patient states understanding and desires to proceed.  The patient is currently on trazodone.  He drinks about 3 beers or glasses of wine a day.  Denies current drug use.  No other anticoagulants, anxiolytics, chronic pain medications, antidepressants, antidiabetics, or iron supplements.  We will plan for the procedure on propofol/MAC to promote adequate sedation.

## 2019-05-23 NOTE — Patient Instructions (Signed)
Your health issues we discussed today were:   Need for a colonoscopy: 1. We will schedule your colonoscopy for you 2. Further recommendations will be made after your colonoscopy  Overall I recommend:  1. Continue your current medications 2. Return for follow-up based on recommendations made after your colonoscopy 3. Call us if you have any questions or concerns.   Because of recent events of COVID-19 ("Coronavirus"), follow CDC recommendations:  1. Wash your hand frequently 2. Avoid touching your face 3. Stay away from people who are sick 4. If you have symptoms such as fever, cough, shortness of breath then call your healthcare provider for further guidance 5. If you are sick, STAY AT HOME unless otherwise directed by your healthcare provider. 6. Follow directions from state and national officials regarding staying safe   At [facility] we value your feedback. You may receive a survey about your visit today. Please share your experience as we strive to create trusting relationships with our patients to provide genuine, compassionate, quality care.  We appreciate your understanding and patience as we review any laboratory studies, imaging, and other diagnostic tests that are ordered as we care for you. Our office policy is 5 business days for review of these results, and any emergent or urgent results are addressed in a timely manner for your best interest. If you do not hear from our office in 1 week, please contact us.   We also encourage the use of MyChart, which contains your medical information for your review as well. If you are not enrolled in this feature, an access code is on this after visit summary for your convenience. Thank you for allowing [place] to be involved in your care.  It was great to see you today!  I hope you have a Happy Thanksgiving!!

## 2019-05-24 DIAGNOSIS — D225 Melanocytic nevi of trunk: Secondary | ICD-10-CM | POA: Diagnosis not present

## 2019-05-24 DIAGNOSIS — L02229 Furuncle of trunk, unspecified: Secondary | ICD-10-CM | POA: Diagnosis not present

## 2019-05-24 DIAGNOSIS — B9689 Other specified bacterial agents as the cause of diseases classified elsewhere: Secondary | ICD-10-CM | POA: Diagnosis not present

## 2019-06-12 ENCOUNTER — Other Ambulatory Visit (HOSPITAL_COMMUNITY): Payer: BLUE CROSS/BLUE SHIELD

## 2019-06-14 ENCOUNTER — Other Ambulatory Visit: Payer: Self-pay | Admitting: Internal Medicine

## 2019-06-14 DIAGNOSIS — I10 Essential (primary) hypertension: Secondary | ICD-10-CM

## 2019-06-25 ENCOUNTER — Other Ambulatory Visit: Payer: Self-pay | Admitting: Internal Medicine

## 2019-06-25 DIAGNOSIS — I10 Essential (primary) hypertension: Secondary | ICD-10-CM

## 2019-07-22 ENCOUNTER — Encounter (HOSPITAL_COMMUNITY): Payer: Self-pay | Admitting: Emergency Medicine

## 2019-07-22 ENCOUNTER — Emergency Department (HOSPITAL_COMMUNITY)
Admission: EM | Admit: 2019-07-22 | Discharge: 2019-07-22 | Disposition: A | Discharge status: Home / Self Care | Payer: 59 | Attending: Emergency Medicine | Admitting: Emergency Medicine

## 2019-07-22 ENCOUNTER — Other Ambulatory Visit: Payer: Self-pay

## 2019-07-22 ENCOUNTER — Emergency Department (HOSPITAL_COMMUNITY): Payer: 59

## 2019-07-22 DIAGNOSIS — F17290 Nicotine dependence, other tobacco product, uncomplicated: Secondary | ICD-10-CM | POA: Insufficient documentation

## 2019-07-22 DIAGNOSIS — R1032 Left lower quadrant pain: Secondary | ICD-10-CM | POA: Diagnosis not present

## 2019-07-22 DIAGNOSIS — R310 Gross hematuria: Secondary | ICD-10-CM | POA: Diagnosis not present

## 2019-07-22 DIAGNOSIS — Z7982 Long term (current) use of aspirin: Secondary | ICD-10-CM | POA: Insufficient documentation

## 2019-07-22 DIAGNOSIS — Z79899 Other long term (current) drug therapy: Secondary | ICD-10-CM | POA: Insufficient documentation

## 2019-07-22 DIAGNOSIS — R93421 Abnormal radiologic findings on diagnostic imaging of right kidney: Secondary | ICD-10-CM | POA: Diagnosis not present

## 2019-07-22 DIAGNOSIS — I10 Essential (primary) hypertension: Secondary | ICD-10-CM | POA: Diagnosis not present

## 2019-07-22 DIAGNOSIS — N2889 Other specified disorders of kidney and ureter: Secondary | ICD-10-CM

## 2019-07-22 DIAGNOSIS — R109 Unspecified abdominal pain: Secondary | ICD-10-CM | POA: Diagnosis present

## 2019-07-22 LAB — URINALYSIS, ROUTINE W REFLEX MICROSCOPIC
Bilirubin Urine: NEGATIVE
Glucose, UA: NEGATIVE mg/dL
Ketones, ur: NEGATIVE mg/dL
Leukocytes,Ua: NEGATIVE
Nitrite: NEGATIVE
Protein, ur: 30 mg/dL — AB
RBC / HPF: >50 RBC/hpf — ABNORMAL HIGH (ref 0–5)
Specific Gravity, Urine: 1.01 (ref 1.005–1.030)
pH: 7 (ref 5.0–8.0)

## 2019-07-22 LAB — CBC
HCT: 44.9 % (ref 39.0–52.0)
Hemoglobin: 15.5 g/dL (ref 13.0–17.0)
MCH: 32.3 pg (ref 26.0–34.0)
MCHC: 34.5 g/dL (ref 30.0–36.0)
MCV: 93.5 fL (ref 80.0–100.0)
Platelets: 189 10*3/uL (ref 150–400)
RBC: 4.8 MIL/uL (ref 4.22–5.81)
RDW: 11.9 % (ref 11.5–15.5)
WBC: 9.8 10*3/uL (ref 4.0–10.5)
nRBC: 0 % (ref 0.0–0.2)

## 2019-07-22 LAB — COMPREHENSIVE METABOLIC PANEL
ALT: 28 U/L (ref 0–44)
AST: 24 U/L (ref 15–41)
Albumin: 4.4 g/dL (ref 3.5–5.0)
Alkaline Phosphatase: 62 U/L (ref 38–126)
Anion gap: 9 (ref 5–15)
BUN: 14 mg/dL (ref 8–23)
CO2: 27 mmol/L (ref 22–32)
Calcium: 9.1 mg/dL (ref 8.9–10.3)
Chloride: 103 mmol/L (ref 98–111)
Creatinine, Ser: 1.11 mg/dL (ref 0.61–1.24)
GFR calc Af Amer: >60 mL/min (ref >60)
GFR calc non Af Amer: >60 mL/min (ref >60)
Glucose, Bld: 145 mg/dL — ABNORMAL HIGH (ref 70–99)
Potassium: 3.6 mmol/L (ref 3.5–5.1)
Sodium: 139 mmol/L (ref 135–145)
Total Bilirubin: 0.5 mg/dL (ref 0.3–1.2)
Total Protein: 7.9 g/dL (ref 6.5–8.1)

## 2019-07-22 LAB — LIPASE, BLOOD: Lipase: 28 U/L (ref 11–51)

## 2019-07-22 MED ORDER — ONDANSETRON HCL 4 MG PO TABS: 4.0000 mg | ORAL_TABLET | Freq: Three times a day (TID) | ORAL | 0 refills | Status: DC | PRN

## 2019-07-22 MED ORDER — HYDROCODONE-ACETAMINOPHEN 5-325 MG PO TABS: 1.0000 | ORAL_TABLET | Freq: Four times a day (QID) | ORAL | 0 refills | Status: DC | PRN

## 2019-07-22 MED ORDER — KETOROLAC TROMETHAMINE 30 MG/ML IJ SOLN
30.0000 mg | Freq: Once | INTRAMUSCULAR | Status: AC
  Administered 2019-07-22: 02:00:00 30 mg via INTRAVENOUS
  Filled 2019-07-22: qty 1

## 2019-07-22 MED ORDER — IOHEXOL 300 MG/ML  SOLN
125.0000 mL | Freq: Once | INTRAMUSCULAR | Status: AC | PRN
  Administered 2019-07-22: 125 mL via INTRAVENOUS

## 2019-07-22 MED ORDER — ONDANSETRON HCL 4 MG/2ML IJ SOLN
4.0000 mg | Freq: Once | INTRAMUSCULAR | Status: AC
  Administered 2019-07-22: 4 mg via INTRAVENOUS
  Filled 2019-07-22: qty 2

## 2019-07-22 NOTE — ED Provider Notes (Signed)
[name] EMERGENCY DEPARTMENT Provider Note   CSN: [id number] Arrival date & time: 07/22/19  0021   Time seen 1:15 AM  History Chief Complaint  Patient presents with  . Abdominal Pain    Thomas Baldwin is a 64 y.o. male.  HPI Patient states he felt fine all day.  At 9 PM tonight he had onset of left flank pain radiating into his left lower abdomen with nausea and vomiting x3, patient had to have vomiting during our exam.  He states he is having some trouble urinating and he is having gross hematuria.  He states sometimes he feels like he is passing small clots.  He states few days ago off and on he would see some blood intermittently in his urine.  He states he has never had this before.  He denies diarrhea, fever, cough, rhinorrhea, sore throat.  Patient does have a history of paroxysmal atrial fib and he takes 81 mg aspirin a day.  He denies any other blood thinners.  He denies any family history of kidney stones.  PCP Isaac Bliss, Rayford Halsted, MD     Past Medical History:  Diagnosis Date  . Atrial fibrillation ([place])   . Atypical chest pain   . GERD (gastroesophageal reflux disease)    Questionable  . Hypertension     Patient Active Problem List   Diagnosis Date Noted  . Preventative health care 05/23/2019  . Impaired glucose tolerance 12/07/2014  . Atrial fibrillation ([place]) 01/08/2012  . Essential hypertension 03/16/2007    Past Surgical History:  Procedure Laterality Date  . NOSE SURGERY  1980  . TONSILECTOMY, ADENOIDECTOMY, BILATERAL MYRINGOTOMY AND TUBES         Family History  Problem Relation Age of Onset  . Coronary artery disease Other   . Diabetes Other   . Hypertension Other   . Colon cancer Neg Hx     Social History   Tobacco Use  . Smoking status: Current Some Day Smoker    Types: Cigars  . Smokeless tobacco: Never Used  Substance Use Topics  . Alcohol use: Yes    Alcohol/week: 21.0 standard drinks    Types: 21 Cans of beer per week    Comment: Socially, 3 beers a day as of 05/23/2019  . Drug use: Not Currently    Home Medications Prior to Admission medications   Medication Sig Start Date End Date Taking? Authorizing Provider  amLODipine (NORVASC) 5 MG tablet Take 1 tablet by mouth once daily 06/26/19   Isaac Bliss, Rayford Halsted, MD  aspirin 81 MG tablet Take 81 mg by mouth daily.    [provider]  HYDROcodone-acetaminophen (NORCO/VICODIN) 5-325 MG tablet Take 1 tablet by mouth every 6 (six) hours as needed for moderate pain or severe pain. 07/22/19   Rolland Porter, MD  losartan (COZAAR) 100 MG tablet Take 1 tablet by mouth once daily 03/21/19   Isaac Bliss, Rayford Halsted, MD  metoprolol tartrate (LOPRESSOR) 50 MG tablet Take 1 tablet by mouth twice daily 03/21/19   Isaac Bliss, Rayford Halsted, MD  Multiple Vitamins-Minerals (MULTIVITAMIN ADULT PO) Take by mouth daily.    [provider]  omeprazole (PRILOSEC) 20 MG capsule Take 1 capsule (20 mg total) by mouth daily. 06/07/18   Isaac Bliss, Rayford Halsted, MD  ondansetron (ZOFRAN) 4 MG tablet Take 1 tablet (4 mg total) by mouth every 8 (eight) hours as needed for nausea or vomiting. 07/22/19   Rolland Porter, MD  polyethylene glycol-electrolytes (TRILYTE)  420 g solution Take 4,000 mLs by mouth as directed. 05/23/19   Daneil Dolin, MD  traZODone (DESYREL) 150 MG tablet TAKE 1 TABLET BY MOUTH AT BEDTIME 06/14/19   Isaac Bliss, Rayford Halsted, MD    Allergies    Patient has no known allergies.  Review of Systems   Review of Systems  All other systems reviewed and are negative.   Physical Exam Updated Vital Signs BP (!) 147/100 (BP Location: Right Arm)   Pulse 84   Temp 97.9 F (36.6 C) (Oral)   Resp 18   Ht 6\' 6"  (1.981 m)   Wt 108.9 kg   SpO2 97%   BMI 27.73 kg/m   Vital signs normal except hypertension   Physical Exam Vitals and nursing note reviewed.  Constitutional:      General: He is not in acute distress.    Appearance: Normal appearance.  He is well-developed. He is not ill-appearing or toxic-appearing.     Comments: Patient vomiting during exam  HENT:     Head: Normocephalic and atraumatic.     Right Ear: External ear normal.     Left Ear: External ear normal.     Nose: Nose normal. No mucosal edema or rhinorrhea.     Mouth/Throat:     Dentition: No dental abscesses.     Pharynx: No uvula swelling.  Eyes:     Conjunctiva/sclera: Conjunctivae normal.     Pupils: Pupils are equal, round, and reactive to light.  Cardiovascular:     Rate and Rhythm: Normal rate and regular rhythm.     Heart sounds: Normal heart sounds. No murmur. No friction rub. No gallop.   Pulmonary:     Effort: Pulmonary effort is normal. No respiratory distress.     Breath sounds: Normal breath sounds. No wheezing, rhonchi or rales.  Chest:     Chest wall: No tenderness or crepitus.  Abdominal:     General: Bowel sounds are normal. There is no distension.     Palpations: Abdomen is soft.     Tenderness: There is no abdominal tenderness. There is no guarding or rebound.       Comments: Area of pain noted, he also indicates pain in his left flank but he does not have pain to percussion.  Musculoskeletal:        General: No tenderness. Normal range of motion.     Cervical back: Full passive range of motion without pain, normal range of motion and neck supple.     Comments: Moves all extremities well.   Skin:    General: Skin is warm and dry.     Coloration: Skin is not pale.     Findings: No erythema or rash.  Neurological:     Mental Status: He is alert and oriented to person, place, and time.     Cranial Nerves: No cranial nerve deficit.  Psychiatric:        Mood and Affect: Mood is not anxious.        Speech: Speech normal.        Behavior: Behavior normal.     ED Results / Procedures / Treatments   Labs (all labs ordered are listed, but only abnormal results are displayed) Results for orders placed or performed during the hospital  encounter of 07/22/19  Lipase, blood  Result Value Ref Range   Lipase 28 11 - 51 U/L  Comprehensive metabolic panel  Result Value Ref Range   Sodium 139 135 -  145 mmol/L   Potassium 3.6 3.5 - 5.1 mmol/L   Chloride 103 98 - 111 mmol/L   CO2 27 22 - 32 mmol/L   Glucose, Bld 145 (H) 70 - 99 mg/dL   BUN 14 8 - 23 mg/dL   Creatinine, Ser 1.11 0.61 - 1.24 mg/dL   Calcium 9.1 8.9 - 10.3 mg/dL   Total Protein 7.9 6.5 - 8.1 g/dL   Albumin 4.4 3.5 - 5.0 g/dL   AST 24 15 - 41 U/L   ALT 28 0 - 44 U/L   Alkaline Phosphatase 62 38 - 126 U/L   Total Bilirubin 0.5 0.3 - 1.2 mg/dL   GFR calc non Af Amer >60 >60 mL/min   GFR calc Af Amer >60 >60 mL/min   Anion gap 9 5 - 15  CBC  Result Value Ref Range   WBC 9.8 4.0 - 10.5 K/uL   RBC 4.80 4.22 - 5.81 MIL/uL   Hemoglobin 15.5 13.0 - 17.0 g/dL   HCT 44.9 39.0 - 52.0 %   MCV 93.5 80.0 - 100.0 fL   MCH 32.3 26.0 - 34.0 pg   MCHC 34.5 30.0 - 36.0 g/dL   RDW 11.9 11.5 - 15.5 %   Platelets 189 150 - 400 K/uL   nRBC 0.0 0.0 - 0.2 %  Urinalysis, Routine w reflex microscopic  Result Value Ref Range   Color, Urine YELLOW YELLOW   APPearance HAZY (A) CLEAR   Specific Gravity, Urine 1.010 1.005 - 1.030   pH 7.0 5.0 - 8.0   Glucose, UA NEGATIVE NEGATIVE mg/dL   Hgb urine dipstick LARGE (A) NEGATIVE   Bilirubin Urine NEGATIVE NEGATIVE   Ketones, ur NEGATIVE NEGATIVE mg/dL   Protein, ur 30 (A) NEGATIVE mg/dL   Nitrite NEGATIVE NEGATIVE   Leukocytes,Ua NEGATIVE NEGATIVE   RBC / HPF >50 (H) 0 - 5 RBC/hpf   WBC, UA 0-5 0 - 5 WBC/hpf   Bacteria, UA RARE (A) NONE SEEN   Squamous Epithelial / LPF 0-5 0 - 5   Mucus PRESENT    Laboratory interpretation all normal except hematuria    EKG EKG Interpretation  Date/Time:  Saturday July 22 2019 02:58:20 EST Ventricular Rate:  83 PR Interval:    QRS Duration: 107 QT Interval:  380 QTC Calculation: 447 R Axis:   95 Text Interpretation: Atrial fibrillation Right axis deviation Baseline wander  Since last tracing 28 Feb 2007 Atrial fibrillation has replaced Normal sinus rhythm Confirmed by Rolland Porter [phone]) on 07/22/2019 3:04:09 AM   Radiology CT Renal Stone Study  Result Date: 07/22/2019 CLINICAL DATA:  Left-sided flank pain and hematuria EXAM: CT ABDOMEN AND PELVIS WITHOUT CONTRAST TECHNIQUE: Multidetector CT imaging of the abdomen and pelvis was performed following the standard protocol without IV contrast. COMPARISON:  None. FINDINGS: Lower chest: The visualized heart size within normal limits. No pericardial fluid/thickening. No hiatal hernia. The visualized portions of the lungs are clear. Hepatobiliary: Although limited due to the lack of intravenous contrast, normal in appearance without gross focal abnormality. No evidence of calcified gallstones or biliary ductal dilatation. Pancreas:  Unremarkable.  No surrounding inflammatory changes. Spleen: Normal in size. Although limited due to the lack of intravenous contrast, normal in appearance. Adrenals/Urinary Tract: Both adrenal glands appear normal. There is rounded hyperdense masslike area seen within the left midpole measuring 2.7 x 2.5 x 2.3 cm which extends into the renal pelvis. There is mild left perinephric and periureteral stranding. No left-sided renal calculi are seen. There  are punctate calcifications seen in the lower pole of the right kidney. No hydronephrosis. The bladder is unremarkable. Stomach/Bowel: The stomach, small bowel, and colon are normal in appearance. No inflammatory changes or obstructive findings. appendix is normal. Vascular/Lymphatic: There are no enlarged abdominal or pelvic lymph nodes. Scattered aortic atherosclerotic calcifications are seen without aneurysmal dilatation. Reproductive: The prostate is unremarkable. Other: No evidence of abdominal wall mass or hernia. Musculoskeletal: No acute or significant osseous findings. IMPRESSION: Rounded hyperdense mass within the left mid pole which could represent a  hemorrhagic renal neoplasm. Would recommend MRI or CT renal mass protocol for further evaluation. Nonobstructing right renal calculi. Aortic Atherosclerosis (ICD10-I70.0). Electronically Signed   By: Prudencio Pair M.D.   On: 07/22/2019 02:59    CT RENAL ABD W/WO  Result Date: 07/22/2019 CLINICAL DATA:  Left flank and left lower quadrant pain. EXAM: CT ABDOMEN WITHOUT AND WITH CONTRAST TECHNIQUE: Multidetector CT imaging of the abdomen was performed following the standard protocol before and following the bolus administration of intravenous contrast. CONTRAST:  141mL OMNIPAQUE IOHEXOL 300 MG/ML  SOLN COMPARISON:  CT stone study earlier same day FINDINGS: Lower chest: Basilar atelectasis. Hepatobiliary: No suspicious focal abnormality within the liver parenchyma. The liver shows diffusely decreased attenuation suggesting fat deposition. Tiny calcified gallstones noted. No intrahepatic or extrahepatic biliary dilation. Pancreas: No focal mass lesion. No dilatation of the main duct. No intraparenchymal cyst. No peripancreatic edema. Spleen: No splenomegaly. No focal mass lesion. Adrenals/Urinary Tract: No adrenal nodule or mass. Tiny stone noted lower pole right kidney better seen on previous noncontrast CT. No suspicious right renal mass lesion. Right ureter is unremarkable. Subtle hypoattenuating lesion seen in the interpolar left kidney on the previous study shows medial hyperenhancement on early phase imaging with diffuse, heterogeneous hypoenhancement on delayed imaging. This lesion measures 2.7 x 3.7 x 2.6 cm in the interpolar region, wedged between calices. Medial aspect of this lesion extends into the central sinus fat. 17 mm exophytic cyst noted anterior interpolar left kidney. Left renal vein patent without filling defect. Left ureter unremarkable. Bladder unremarkable. Stomach/Bowel: Moderate gastric distension. Duodenum is normally positioned as is the ligament of Treitz. Duodenal diverticulum noted. No  small bowel wall thickening. No small bowel dilatation. The terminal ileum is normal. The appendix is normal. No gross colonic mass. No colonic wall thickening. Vascular/Lymphatic: There is abdominal aortic atherosclerosis without aneurysm. Portal vein is patent. There is no gastrohepatic or hepatoduodenal ligament lymphadenopathy. No retroperitoneal or mesenteric lymphadenopathy. No pelvic sidewall lymphadenopathy. Other: No intraperitoneal free fluid. Musculoskeletal: Small right groin hernia contains only fat. Small lucency posterior right iliac bone, likely benign. No worrisome lytic or sclerotic osseous abnormality. IMPRESSION: 1. 3.7 cm lesion of question on noncontrast CT earlier today demonstrates heterogeneous enhancement after IV contrast on today's study. Delayed imaging shows hypoenhancement relative to background renal parenchyma. Imaging features consistent with neoplasm. Lesion extends into the central sinus fat in generates some mass-effect on adjacent calices. No filling defect in the left renal vein. No abdominal retroperitoneal lymphadenopathy or evidence of metastatic disease elsewhere in the abdomen/pelvis. 2. Punctate nonobstructing right renal stone. 3. Cholelithiasis. 4.  Aortic Atherosclerois (ICD10-170.0) Electronically Signed   By: Misty Stanley M.D.   On: 07/22/2019 04:53      Procedures Procedures (including critical care time)  Medications Ordered in ED Medications  ondansetron (ZOFRAN) injection 4 mg (4 mg Intravenous Given 07/22/19 0158)  ketorolac (TORADOL) 30 MG/ML injection 30 mg (30 mg Intravenous Given 07/22/19 0158)  iohexol (OMNIPAQUE)  300 MG/ML solution 125 mL (125 mLs Intravenous Contrast Given 07/22/19 0357)    ED Course  I have reviewed the triage vital signs and the nursing notes.  Pertinent labs & imaging results that were available during my care of the patient were reviewed by me and considered in my medical decision making (see chart for details).    MDM  Rules/Calculators/A&P                      Patient symptoms are concerning for left ureteral stone.  When I review his labs he had a normal renal function in February 2020.  He was given IV Zofran for his nausea and IV Toradol for his suspected renal stone.  CT renal was ordered to evaluate his pain further.  Bladder scan was done to make sure he did not have urinary retention. He has grossly bloody urine in urinal.   02:30 AM Bladder scan 300 cc. Doesn't feel like he needs to go now.  Going to CT now. States his pain is better.   Nurse reports patient voided about 300 cc of urine.  I have reviewed his CT scan.  3:15 AM I talked to the patient about his test results.  He states he has had an MRI before and had difficulty doing it.  I will see if I can order the CT with contrast here tonight.  He does not have a urologist and I will [facility].  Unfortunately patient is in atrial fibrillation so he will need to continue his baby aspirin daily.    Final Clinical Impression(s) / ED Diagnoses Final diagnoses:  Gross hematuria  Renal mass, left    Rx / DC Orders ED Discharge Orders         Ordered    HYDROcodone-acetaminophen (NORCO/VICODIN) 5-325 MG tablet  Every 6 hours PRN     07/22/19 0506    ondansetron (ZOFRAN) 4 MG tablet  Every 8 hours PRN     07/22/19 0506          Plan discharge  Rolland Porter, MD, Barbette Or, MD 07/22/19 [phone]

## 2019-07-22 NOTE — Discharge Instructions (Addendum)
Drink plenty of fluids to keep your urine dilute so you do not form blood clots which could make it so you cannot urinate.  Please follow-up with [facility], you have a 3.7 cm mass on your left kidney that is concerning for cancer.  Return to the emergency department if you are unable to urinate, you start feeling weak, lightheaded or dizzy on standing.  Take the medication as prescribed if you need it.  Continue your baby aspirin.  I would avoid medications such as Motrin, Advil, Aleve, Naprosyn.

## 2019-07-22 NOTE — ED Triage Notes (Signed)
Pt with c/o lower abdominal pain, vomiting x3, urinary frequency with blood.

## 2019-07-22 NOTE — ED Notes (Signed)
Pts Spouse Ronni Rumble to update staff she is heading home and only lives 3 blocks away. She can return to pick up her husband when he is ready. Her call back phone number is in Pts chart.

## 2019-07-22 NOTE — ED Notes (Signed)
Patient states that he is comfortable at this time and is in no pain. Patient currently in Atrial fibrillation.

## 2019-07-22 NOTE — ED Notes (Signed)
Bladder scan revealed slightly more than 333ml. Pt void 352ml right after

## 2019-07-24 ENCOUNTER — Other Ambulatory Visit: Payer: Self-pay | Admitting: Urology

## 2019-07-28 ENCOUNTER — Other Ambulatory Visit: Payer: Self-pay

## 2019-07-28 ENCOUNTER — Encounter (HOSPITAL_BASED_OUTPATIENT_CLINIC_OR_DEPARTMENT_OTHER): Payer: Self-pay | Admitting: Urology

## 2019-07-28 NOTE — Progress Notes (Addendum)
Spoke w/ via phone for pre-op interview---[facility] needs dos----     I stat 8         Lab results------ COVID test ------08-01-2019 Arrive at -------530 am 08-04-2019 NPO after ------midnight Medications to take morning of surgery -----lopressor, norvas, prilosec Diabetic medication ----n/a- Patient Special Instructions ----- Pre-Op special Istructions ----- Patient verbalized understanding of instructions that were given at this phone interview. Patient denies shortness of breath, chest pain, fever, cough a this phone interview.  Anesthesia : history of atrial fib PCP: dr Netta Corrigan 09-01-2018 epic Cardiologist :none Chest x-ray :none EKG :07-22-2019 Echo :none Cardiac Cath : none Sleep Study/ CPAP :none Fasting Blood Sugar :      / Checks Blood Sugar -- times a day:  n/a Blood Thinner/ Instructions /Last Dose:none ASA / Instructions/ Last Dose : stopping 81 mg aspirin 5 days before per dr Louis Meckel  Patient denies shortness of breath, chest pain, fever, and cough at this phone interview.  Addendum: ok to proceed with 08-04-2019 surgery  per Janett Billow zanetto pa

## 2019-08-01 ENCOUNTER — Other Ambulatory Visit (HOSPITAL_COMMUNITY)
Admission: RE | Admit: 2019-08-01 | Discharge: 2019-08-01 | Disposition: A | Discharge status: Home / Self Care | Payer: 59 | Source: Ambulatory Visit | Attending: Urology | Admitting: Urology

## 2019-08-01 ENCOUNTER — Other Ambulatory Visit (HOSPITAL_COMMUNITY): Payer: Self-pay

## 2019-08-01 ENCOUNTER — Other Ambulatory Visit: Payer: Self-pay

## 2019-08-01 DIAGNOSIS — Z01812 Encounter for preprocedural laboratory examination: Secondary | ICD-10-CM | POA: Insufficient documentation

## 2019-08-01 DIAGNOSIS — Z20822 Contact with and (suspected) exposure to covid-19: Secondary | ICD-10-CM | POA: Insufficient documentation

## 2019-08-01 LAB — SARS CORONAVIRUS 2 (TAT 6-24 HRS): SARS Coronavirus 2: NEGATIVE

## 2019-08-02 ENCOUNTER — Telehealth: Payer: Self-pay | Admitting: *Deleted

## 2019-08-02 NOTE — Telephone Encounter (Signed)
Pt requested to cancel his procedure.  He said that he has kidney cancer.  He wants to get back in touch with [place] once he is ready to reschedule.  Endo notified.

## 2019-08-02 NOTE — Telephone Encounter (Signed)
Noted. FYI to EG.

## 2019-08-03 NOTE — Anesthesia Preprocedure Evaluation (Addendum)
Anesthesia Evaluation  Patient identified by MRN, date of birth, ID band Patient awake    Reviewed: Allergy & Precautions, NPO status , Patient's Chart, lab work & pertinent test results, reviewed documented beta blocker date and time   History of Anesthesia Complications Negative for: history of anesthetic complications  Airway Mallampati: II  TM Distance: >3 FB Neck ROM: Full    Dental no notable dental hx.    Pulmonary Current Smoker and Patient abstained from smoking.,    Pulmonary exam normal        Cardiovascular hypertension, Pt. on medications and Pt. on home beta blockers Normal cardiovascular exam+ dysrhythmias Atrial Fibrillation      Neuro/Psych negative neurological ROS  negative psych ROS   GI/Hepatic GERD  Medicated and Controlled,EtOH "21 drinks per week"   Endo/Other  negative endocrine ROS  Renal/GU negative Renal ROS  negative genitourinary   Musculoskeletal negative musculoskeletal ROS (+)   Abdominal   Peds  Hematology negative hematology ROS (+)   Anesthesia Other Findings Day of surgery medications reviewed with patient.  Reproductive/Obstetrics negative OB ROS                            Anesthesia Physical Anesthesia Plan  ASA: II  Anesthesia Plan: General   Post-op Pain Management:    Induction: Intravenous  PONV Risk Score and Plan: 1 and Treatment may vary due to age or medical condition, Ondansetron, Dexamethasone and Midazolam  Airway Management Planned: LMA  Additional Equipment: None  Intra-op Plan:   Post-operative Plan: Extubation in OR  Informed Consent: I have reviewed the patients History and Physical, chart, labs and discussed the procedure including the risks, benefits and alternatives for the proposed anesthesia with the patient or authorized representative who has indicated his/her understanding and acceptance.     Dental advisory  given  Plan Discussed with: CRNA  Anesthesia Plan Comments:        Anesthesia Quick Evaluation

## 2019-08-04 ENCOUNTER — Ambulatory Visit (HOSPITAL_BASED_OUTPATIENT_CLINIC_OR_DEPARTMENT_OTHER): Payer: 59 | Admitting: Physician Assistant

## 2019-08-04 ENCOUNTER — Ambulatory Visit (HOSPITAL_BASED_OUTPATIENT_CLINIC_OR_DEPARTMENT_OTHER)
Admission: RE | Admit: 2019-08-04 | Discharge: 2019-08-04 | Disposition: A | Discharge status: Home / Self Care | Payer: 59 | Attending: Urology | Admitting: Urology

## 2019-08-04 ENCOUNTER — Encounter (HOSPITAL_BASED_OUTPATIENT_CLINIC_OR_DEPARTMENT_OTHER)
Admission: RE | Disposition: A | Discharge status: Home / Self Care | Payer: Self-pay | Source: Home / Self Care | Attending: Urology

## 2019-08-04 ENCOUNTER — Encounter (HOSPITAL_BASED_OUTPATIENT_CLINIC_OR_DEPARTMENT_OTHER): Payer: Self-pay | Admitting: Urology

## 2019-08-04 DIAGNOSIS — R310 Gross hematuria: Secondary | ICD-10-CM | POA: Diagnosis present

## 2019-08-04 DIAGNOSIS — Z79899 Other long term (current) drug therapy: Secondary | ICD-10-CM | POA: Diagnosis not present

## 2019-08-04 DIAGNOSIS — Z7982 Long term (current) use of aspirin: Secondary | ICD-10-CM | POA: Diagnosis not present

## 2019-08-04 DIAGNOSIS — I4891 Unspecified atrial fibrillation: Secondary | ICD-10-CM | POA: Insufficient documentation

## 2019-08-04 DIAGNOSIS — N2889 Other specified disorders of kidney and ureter: Secondary | ICD-10-CM | POA: Diagnosis present

## 2019-08-04 DIAGNOSIS — I10 Essential (primary) hypertension: Secondary | ICD-10-CM | POA: Insufficient documentation

## 2019-08-04 DIAGNOSIS — K219 Gastro-esophageal reflux disease without esophagitis: Secondary | ICD-10-CM | POA: Insufficient documentation

## 2019-08-04 HISTORY — PX: CYSTOSCOPY WITH RETROGRADE PYELOGRAM, URETEROSCOPY AND STENT PLACEMENT: SHX5789

## 2019-08-04 HISTORY — DX: Other injury of unspecified body region, initial encounter: T14.8XXA

## 2019-08-04 HISTORY — DX: Hematuria, unspecified: R31.9

## 2019-08-04 LAB — POCT I-STAT, CHEM 8
BUN: 14 mg/dL (ref 8–23)
Calcium, Ion: 1.26 mmol/L (ref 1.15–1.40)
Chloride: 100 mmol/L (ref 98–111)
Creatinine, Ser: 1.1 mg/dL (ref 0.61–1.24)
Glucose, Bld: 111 mg/dL — ABNORMAL HIGH (ref 70–99)
HCT: 46 % (ref 39.0–52.0)
Hemoglobin: 15.6 g/dL (ref 13.0–17.0)
Potassium: 3.7 mmol/L (ref 3.5–5.1)
Sodium: 141 mmol/L (ref 135–145)
TCO2: 31 mmol/L (ref 22–32)

## 2019-08-04 SURGERY — CYSTOURETEROSCOPY, WITH RETROGRADE PYELOGRAM AND STENT INSERTION
Anesthesia: General | Site: Ureter | Laterality: Bilateral

## 2019-08-04 MED ORDER — CIPROFLOXACIN HCL 500 MG PO TABS: 500.0000 mg | ORAL_TABLET | Freq: Once | ORAL | 0 refills | Status: AC

## 2019-08-04 MED ORDER — PROPOFOL 10 MG/ML IV BOLUS
INTRAVENOUS | Status: AC
  Filled 2019-08-04: qty 40

## 2019-08-04 MED ORDER — MIDAZOLAM HCL 2 MG/2ML IJ SOLN
INTRAMUSCULAR | Status: AC
  Filled 2019-08-04: qty 2

## 2019-08-04 MED ORDER — LIDOCAINE HCL URETHRAL/MUCOSAL 2 % EX GEL
CUTANEOUS | Status: DC | PRN
  Administered 2019-08-04: 1 via URETHRAL

## 2019-08-04 MED ORDER — CEFAZOLIN SODIUM-DEXTROSE 2-4 GM/100ML-% IV SOLN
INTRAVENOUS | Status: AC
  Filled 2019-08-04: qty 100

## 2019-08-04 MED ORDER — DEXAMETHASONE SODIUM PHOSPHATE 10 MG/ML IJ SOLN
INTRAMUSCULAR | Status: DC | PRN
  Administered 2019-08-04: 5 mg via INTRAVENOUS

## 2019-08-04 MED ORDER — IOHEXOL 300 MG/ML  SOLN
INTRAMUSCULAR | Status: DC | PRN
  Administered 2019-08-04: 40 mL

## 2019-08-04 MED ORDER — KETOROLAC TROMETHAMINE 30 MG/ML IJ SOLN
INTRAMUSCULAR | Status: AC
  Filled 2019-08-04: qty 1

## 2019-08-04 MED ORDER — OXYCODONE HCL 5 MG PO TABS
5.0000 mg | ORAL_TABLET | Freq: Once | ORAL | Status: AC | PRN
  Administered 2019-08-04: 11:00:00 5 mg via ORAL
  Filled 2019-08-04: qty 1

## 2019-08-04 MED ORDER — ACETAMINOPHEN 500 MG PO TABS
1000.0000 mg | ORAL_TABLET | Freq: Once | ORAL | Status: AC
  Administered 2019-08-04: 1000 mg via ORAL
  Filled 2019-08-04: qty 2

## 2019-08-04 MED ORDER — FENTANYL CITRATE (PF) 100 MCG/2ML IJ SOLN
25.0000 ug | INTRAMUSCULAR | Status: DC | PRN
  Filled 2019-08-04: qty 1

## 2019-08-04 MED ORDER — ONDANSETRON HCL 4 MG/2ML IJ SOLN
INTRAMUSCULAR | Status: AC
  Filled 2019-08-04: qty 2

## 2019-08-04 MED ORDER — LACTATED RINGERS IV SOLN
INTRAVENOUS | Status: DC
  Filled 2019-08-04: qty 1000

## 2019-08-04 MED ORDER — PHENAZOPYRIDINE HCL 200 MG PO TABS: 200.0000 mg | ORAL_TABLET | Freq: Three times a day (TID) | ORAL | 0 refills | Status: DC | PRN

## 2019-08-04 MED ORDER — ONDANSETRON HCL 4 MG/2ML IJ SOLN
INTRAMUSCULAR | Status: DC | PRN
  Administered 2019-08-04: 4 mg via INTRAVENOUS

## 2019-08-04 MED ORDER — LIDOCAINE 2% (20 MG/ML) 5 ML SYRINGE
INTRAMUSCULAR | Status: DC | PRN
  Administered 2019-08-04: 50 mg via INTRAVENOUS

## 2019-08-04 MED ORDER — PROMETHAZINE HCL 25 MG/ML IJ SOLN
6.2500 mg | INTRAMUSCULAR | Status: DC | PRN
  Filled 2019-08-04: qty 1

## 2019-08-04 MED ORDER — SODIUM CHLORIDE 0.9 % IR SOLN
Status: DC | PRN
  Administered 2019-08-04: 6000 mL

## 2019-08-04 MED ORDER — MIDAZOLAM HCL 2 MG/2ML IJ SOLN
INTRAMUSCULAR | Status: DC | PRN
  Administered 2019-08-04: 1 mg via INTRAVENOUS

## 2019-08-04 MED ORDER — KETOROLAC TROMETHAMINE 30 MG/ML IJ SOLN
INTRAMUSCULAR | Status: DC | PRN
  Administered 2019-08-04: 30 mg via INTRAVENOUS

## 2019-08-04 MED ORDER — OXYCODONE HCL 5 MG/5ML PO SOLN
5.0000 mg | Freq: Once | ORAL | Status: AC | PRN
  Filled 2019-08-04: qty 5

## 2019-08-04 MED ORDER — DEXAMETHASONE SODIUM PHOSPHATE 10 MG/ML IJ SOLN
INTRAMUSCULAR | Status: AC
  Filled 2019-08-04: qty 1

## 2019-08-04 MED ORDER — CEFAZOLIN SODIUM-DEXTROSE 2-4 GM/100ML-% IV SOLN
2.0000 g | INTRAVENOUS | Status: AC
  Administered 2019-08-04: 08:00:00 2 mg via INTRAVENOUS
  Filled 2019-08-04: qty 100

## 2019-08-04 MED ORDER — PROPOFOL 10 MG/ML IV BOLUS
INTRAVENOUS | Status: DC | PRN
  Administered 2019-08-04: 150 mg via INTRAVENOUS

## 2019-08-04 MED ORDER — TRAMADOL HCL 50 MG PO TABS: 50.0000 mg | ORAL_TABLET | Freq: Four times a day (QID) | ORAL | 0 refills | Status: DC | PRN

## 2019-08-04 MED ORDER — FENTANYL CITRATE (PF) 100 MCG/2ML IJ SOLN
INTRAMUSCULAR | Status: DC | PRN
  Administered 2019-08-04: 25 ug via INTRAVENOUS
  Administered 2019-08-04: 50 ug via INTRAVENOUS
  Administered 2019-08-04: 25 ug via INTRAVENOUS

## 2019-08-04 MED ORDER — OXYCODONE HCL 5 MG PO TABS
ORAL_TABLET | ORAL | Status: AC
  Filled 2019-08-04: qty 1

## 2019-08-04 MED ORDER — ACETAMINOPHEN 500 MG PO TABS
ORAL_TABLET | ORAL | Status: AC
  Filled 2019-08-04: qty 2

## 2019-08-04 MED ORDER — LIDOCAINE 2% (20 MG/ML) 5 ML SYRINGE
INTRAMUSCULAR | Status: AC
  Filled 2019-08-04: qty 5

## 2019-08-04 MED ORDER — FENTANYL CITRATE (PF) 100 MCG/2ML IJ SOLN
INTRAMUSCULAR | Status: AC
  Filled 2019-08-04: qty 2

## 2019-08-04 SURGICAL SUPPLY — 24 items
APL SKNCLS STERI-STRIP NONHPOA (GAUZE/BANDAGES/DRESSINGS) ×1
BAG DRAIN URO-CYSTO SKYTR STRL (DRAIN) ×2 IMPLANT
BAG DRN UROCATH (DRAIN) ×1
BASKET LASER NITINOL 1.9FR (BASKET) IMPLANT
BENZOIN TINCTURE PRP APPL 2/3 (GAUZE/BANDAGES/DRESSINGS) ×1 IMPLANT
BSKT STON RTRVL 120 1.9FR (BASKET)
CATH URET 5FR 28IN OPEN ENDED (CATHETERS) ×2 IMPLANT
CATH URET DUAL LUMEN 6-10FR 50 (CATHETERS) IMPLANT
CLOTH BEACON ORANGE TIMEOUT ST (SAFETY) ×2 IMPLANT
DRSG TEGADERM 2-3/8X2-3/4 SM (GAUZE/BANDAGES/DRESSINGS) ×1 IMPLANT
EXTRACTOR STONE 1.7FRX115CM (UROLOGICAL SUPPLIES) IMPLANT
FIBER LASER TRAC TIP (UROLOGICAL SUPPLIES) IMPLANT
GLOVE BIO SURGEON STRL SZ7.5 (GLOVE) ×2 IMPLANT
GOWN STRL REUS W/TWL XL LVL3 (GOWN DISPOSABLE) ×2 IMPLANT
GUIDEWIRE STR DUAL SENSOR (WIRE) ×2 IMPLANT
IV NS IRRIG 3000ML ARTHROMATIC (IV SOLUTION) ×4 IMPLANT
KIT TURNOVER CYSTO (KITS) ×2 IMPLANT
MANIFOLD NEPTUNE II (INSTRUMENTS) IMPLANT
NS IRRIG 500ML POUR BTL (IV SOLUTION) ×2 IMPLANT
PACK CYSTO (CUSTOM PROCEDURE TRAY) ×2 IMPLANT
SHEATH URETERAL 12FRX35CM (MISCELLANEOUS) ×1 IMPLANT
STENT URET 6FRX28 CONTOUR (STENTS) ×1 IMPLANT
TUBE CONNECTING 12X1/4 (SUCTIONS) IMPLANT
TUBING UROLOGY SET (TUBING) ×2 IMPLANT

## 2019-08-04 NOTE — Op Note (Signed)
Preoperative diagnosis:  1. Left renal mass  2. Gross hematuria  Postoperative diagnosis:  1. Same  Procedure: 1. Cystoscopy, bilateral retrograde pyelogram 2. Left ureteroscopy 3. Left ureteral stent placement  Surgeon: Ardis Hughs, MD  Anesthesia: General  Complications: None  Intraoperative findings: 1.:  The retrograde pyelogram on the patient's right-hand side demonstrated normal caliber ureter with no hydronephrosis or filling defects.  The calyces were sharp. 2.:  The left retrograde pyelogram demonstrated initial narrowing at the UVJ, the remaining aspect of the ureteral filling was normal with no significant hydroureteronephrosis. There is no appreciable tumor or filling defect. 3.:  Left ureteroscopy demonstrated normal mucosa within the left ureter albeit slightly narrowed.  Ultimately, the UVJ area did appear normal upon a draining film.  There was some delayed filling within the intrapolar region of the left everything appeared to be normal except the mid posterior calyx there was hypervascularity, but no tumors or masses notable.   EBL: Minimal  Specimens: None  Indication: ABBOTT MICHAELIS is a 64 y.o. patient with a tumor in the left kidney that was central and presented initially with gross hematuria.  After reviewing the management options for treatment, he elected to proceed with the above surgical procedure(s). We have discussed the potential benefits and risks of the procedure, side effects of the proposed treatment, the likelihood of the patient achieving the goals of the procedure, and any potential problems that might occur during the procedure or recuperation. Informed consent has been obtained.  Description of procedure:  The patient was taken to the operating room and general anesthesia was induced.  The patient was placed in the dorsal lithotomy position, prepped and draped in the usual sterile fashion, and preoperative antibiotics were administered. A  preoperative time-out was performed.   I advanced a 30 degree 21 [place] scope into the patient's urethra and up into the bladder under visual guidance.  Cystoscopy was performed with no significant bladder abnormalities.  I then performed bilateral retrograde pyelograms with the above findings.  I then advanced a sensor wire through the scope and into the bladder and cannulated the left ureteral orifice.  Using a semirigid ureteroscope  I advanced this up into the proximal ureter noting no significant filling defect.  I advanced a second wire through the ureteroscope and removed the ureteroscope over the wire.  I then attempted to advance a flexible ureteroscope over the second wire and was unable to.  As such, I advanced the inner portion of a 12/14 [place] ureteral access sheath over the wire and easily was able to cannulate the left ureteral orifice and advanced up to the mid ureter.  I subsequently advanced both the inner/outer portions of the access sheath under fluoroscopic guidance removing the inner portion as well as a second wire.  I subsequently advanced a single lumen flexible ureteroscope through the access sheath and up into the renal pelvis.  Pyeloscopy was performed noting no significant abnormalities except for the area of the posterior interpolar calyx that was hypervascular.  As such, I did not perform any biopsies, but slowly backed out the ureteroscope as well as the access sheath simultaneously to ensure there was no significant ureteral trauma.  I then backloaded the cystoscope over the wire and advanced the cystoscope back into the patient's bladder under visual guidance.  I advanced a 28 cm time 6 French double-J ureteral stent over the wire and into the left renal pelvis.  Fluoroscopy confirmed excellent stent placement in the kidney as  well as within the bladder.  I subsequently emptied the bladder and pulled the stent tether through the urethra.  I secured the stent tether to the  dorsum of the patient's penis.  The patient was subsequently extubated and returned to the PACU in stable condition.   Ardis Hughs, M.D.

## 2019-08-04 NOTE — Telephone Encounter (Signed)
Noted, sorry to hear about his cancer. I wish him a good outcome!

## 2019-08-04 NOTE — Transfer of Care (Signed)
Immediate Anesthesia Transfer of Care Note  Patient: JAHMAR DOBISH  Procedure(s) Performed: Procedure(s) (LRB): CYSTOSCOPY WITH BILATERAL  RETROGRADE PYELOGRAM, LEFT DIAGNOSTIC URETEROSCOPY  AND POSSIBLE BIOPSY AND STENT PLACEMENT (Bilateral)  Patient Location: PACU  Anesthesia Type: General  Level of Consciousness: awake, oriented, sedated and patient cooperative  Airway & Oxygen Therapy: Patient Spontanous Breathing and Patient connected to face mask oxygen  Post-op Assessment: Report given to PACU RN and Post -op Vital signs reviewed and stable  Post vital signs: Reviewed and stable  Complications: No apparent anesthesia complications Last Vitals:  Vitals Value Taken Time  BP    Temp    Pulse    Resp    SpO2      Last Pain:  Vitals:   08/04/19 0606  TempSrc: Oral  PainSc: 0-No pain      Patients Stated Pain Goal: 5 (08/04/19 0606)

## 2019-08-04 NOTE — Discharge Instructions (Signed)
DISCHARGE INSTRUCTIONS FOR KIDNEY STONE/URETERAL STENT   MEDICATIONS:  1.  Resume all your other meds from home - except do not take any extra narcotic pain meds that you may have at home.  2.  Pyridium is to help with the burning/stinging when you urinate. 3.  Tramadol is for moderate/severe pain, otherwise taking upto 1000 mg every 6 hours of plainTylenol will help treat your pain.   4.  Take Cipro one hour prior to removal of your stent.   ACTIVITY:  1. No strenuous activity x 1week  2. No driving while on narcotic pain medications  3. Drink plenty of water  4. Continue to walk at home - you can still get blood clots when you are at home, so keep active, but don't over do it.  5. May return to work/school tomorrow or when you feel ready   BATHING:  1. You can shower and we recommend daily showers  2. You have a string coming from your urethra: The stent string is attached to your ureteral stent. Do not pull on this.   SIGNS/SYMPTOMS TO CALL:  Please call us if you have a fever greater than 101.5, uncontrolled nausea/vomiting, uncontrolled pain, dizziness, unable to urinate, bloody urine, chest pain, shortness of breath, leg swelling, leg pain, redness around wound, drainage from wound, or any other concerns or questions.   You can reach [place] at [phone].   FOLLOW-UP:  1. You have an appointment with Dr. Louis Meckel on 08/14/19 for f/u from surgery. 2. You have a string attached to your stent, you may remove it on 08/07/19. To do this, pull the strings until the stents are completely removed. You may feel an odd sensation in your back.  No advil, aleve, motrin, ibuprofen until 245 pm today  Post Anesthesia Home Care Instructions  Activity: Get plenty of rest for the remainder of the day. A responsible adult should stay with you for 24 hours following the procedure.  For the next 24 hours, DO NOT: -Drive a car -Paediatric nurse -Drink alcoholic beverages -Take any medication unless  instructed by your physician -Make any legal decisions or sign important papers.  Meals: Start with liquid foods such as gelatin or soup. Progress to regular foods as tolerated. Avoid greasy, spicy, heavy foods. If nausea and/or vomiting occur, drink only clear liquids until the nausea and/or vomiting subsides. Call your physician if vomiting continues.  Special Instructions/Symptoms: Your throat may feel dry or sore from the anesthesia or the breathing tube placed in your throat during surgery. If this causes discomfort, gargle with warm salt water. The discomfort should disappear within 24 hours.  If you had a scopolamine patch placed behind your ear for the management of post- operative nausea and/or vomiting:  1. The medication in the patch is effective for 72 hours, after which it should be removed.  Wrap patch in a tissue and discard in the trash. Wash hands thoroughly with soap and water. 2. You may remove the patch earlier than 72 hours if you experience unpleasant side effects which may include dry mouth, dizziness or visual disturbances. 3. Avoid touching the patch. Wash your hands with soap and water after contact with the patch.

## 2019-08-04 NOTE — Anesthesia Procedure Notes (Signed)
Procedure Name: LMA Insertion Date/Time: 08/04/2019 7:48 AM Performed by: Suan Halter, CRNA Pre-anesthesia Checklist: Patient identified, Emergency Drugs available, Suction available and Patient being monitored Patient Re-evaluated:Patient Re-evaluated prior to induction Oxygen Delivery Method: Circle system utilized Preoxygenation: Pre-oxygenation with 100% oxygen Induction Type: IV induction Ventilation: Mask ventilation without difficulty LMA: LMA inserted LMA Size: 5.0 Number of attempts: 1 Airway Equipment and Method: Bite block Placement Confirmation: positive ETCO2 Tube secured with: Tape Dental Injury: Teeth and Oropharynx as per pre-operative assessment

## 2019-08-04 NOTE — H&P (Signed)
Renal Mass  HPI: Thomas Baldwin is a 64 year-old male patient who is here further eval and management of a renal mass.  The mass is on the left side.   The lesion(s) was first noted on 07/22/2019. The mass was seen on CT Scan.   His symptoms include flank pain and blood in urine. Patient denies having back pain, groin pain, nausea, vomiting, fever, and chills. He has seen blood in his urine. He does have a good appetite. He is not having pain in new locations. He has not recently had unwanted weight loss.   The patient can walk a flight of steps.   The patient denies history of diabetes, heart attack or stroke. There is not a a family history of kidney cancer. There is no family history of brain tumors (AMLs), seizures or brain aneurysm's.   CT scan - 2.7x2.5x2.3 in central left kidney. The CT scan was obtained because the patient was have in left-sided renal colic like symptoms and hematuria. He has not had any ongoing hematuria or colic since he was released from the emergency department.     ALLERGIES: None   MEDICATIONS: Metoprolol Tartrate 50 mg tablet  Omeprazole 20 mg capsule,delayed release  Amlodipine Besylate 5 mg tablet  Aspirin Ec 81 mg tablet, delayed release  Losartan Potassium 100 mg tablet  Multivitamin  Trazodone Hcl 150 mg tablet     GU PSH: None   NON-GU PSH: Eye Surgery (Unspecified)     GU PMH: None   NON-GU PMH: Arrhythmia Atrial Fibrillation GERD Hypertension    FAMILY HISTORY: None   SOCIAL HISTORY: Marital Status: Married Preferred Language: English; Ethnicity: Not Hispanic Or Latino; Race: White Current Smoking Status: Patient has never smoked.   Tobacco Use Assessment Completed: Used Tobacco in last 30 days? Does not use smokeless tobacco. Does drink.  Does not use drugs. Drinks 1 caffeinated drink per day.    REVIEW OF SYSTEMS:    GU Review Male:   Patient reports get up at night to urinate and stream starts and stops. Patient denies  frequent urination, hard to postpone urination, burning/ pain with urination, leakage of urine, trouble starting your stream, have to strain to urinate , erection problems, and penile pain.  Gastrointestinal (Upper):   Patient reports vomiting. Patient denies nausea and indigestion/ heartburn.  Gastrointestinal (Lower):   Patient denies diarrhea and constipation.  Constitutional:   Patient denies fever, night sweats, weight loss, and fatigue.  Skin:   Patient denies skin rash/ lesion and itching.  Eyes:   Patient denies blurred vision and double vision.  Ears/ Nose/ Throat:   Patient denies sore throat and sinus problems.  Hematologic/Lymphatic:   Patient denies swollen glands and easy bruising.  Cardiovascular:   Patient denies leg swelling and chest pains.  Respiratory:   Patient denies cough and shortness of breath.  Endocrine:   Patient denies excessive thirst.  Musculoskeletal:   Patient denies back pain and joint pain.  Neurological:   Patient denies headaches and dizziness.  Psychologic:   Patient denies depression and anxiety.   Notes: Weak stream, Hematuria    VITAL SIGNS:      07/24/2019 01:43 PM  Weight 240 lb / 108.86 kg  Height 78 in / 198.12 cm  BP 142/84 mmHg  Pulse 94 /min  Temperature 97.7 F / 36.5 C  BMI 27.7 kg/m   MULTI-SYSTEM PHYSICAL EXAMINATION:    Constitutional: Well-nourished. No physical deformities. Normally developed. Good grooming.  Neck: Neck symmetrical,  not swollen. Normal tracheal position.  Respiratory: No labored breathing, no use of accessory muscles.   Cardiovascular: Normal temperature, normal extremity pulses, no swelling, no varicosities.  Lymphatic: No enlargement of neck, axillae, groin.  Skin: No paleness, no jaundice, no cyanosis. No lesion, no ulcer, no rash.  Neurologic / Psychiatric: Oriented to time, oriented to place, oriented to person. No depression, no anxiety, no agitation.  Gastrointestinal: No mass, no tenderness, no  rigidity, non obese abdomen.  Eyes: Normal conjunctivae. Normal eyelids.  Ears, Nose, Mouth, and Throat: Left ear no scars, no lesions, no masses. Right ear no scars, no lesions, no masses. Nose no scars, no lesions, no masses. Normal hearing. Normal lips.  Musculoskeletal: Normal gait and station of head and neck.     PAST DATA REVIEWED:  Source Of History:  Patient  Records Review:   Previous Doctor Records, Previous Patient Records, POC Tool  X-Ray Review: C.T. Abdomen/Pelvis: Reviewed Films. Discussed With Patient.     PROCEDURES:          Urinalysis w/Scope Dipstick Dipstick Cont'd Micro  Color: Amber Bilirubin: Neg mg/dL WBC/hpf: 0 - 5/hpf  Appearance: Clear Ketones: Neg mg/dL RBC/hpf: NS (Not Seen)  Specific Gravity: 1.025 Blood: Neg ery/uL Bacteria: Rare (0-9/hpf)  pH: 6.0 Protein: 1+ mg/dL Cystals: NS (Not Seen)  Glucose: Neg mg/dL Urobilinogen: 1.0 mg/dL Casts: NS (Not Seen)    Nitrites: Neg Trichomonas: Not Present    Leukocyte Esterase: Neg leu/uL Mucous: Present      Epithelial Cells: NS (Not Seen)      Yeast: NS (Not Seen)      Sperm: Not Present    ASSESSMENT:      ICD-10 Details  1 GU:   Benign Neo Kidney, Unspec - D30.00   2   Gross hematuria - R31.0    PLAN:           Document Letter(s):  Created for Patient: Clinical Summary         Notes:   The patient has a ill-defined centrally located renal mass in the left kidney and associated gross hematuria. Concerning is the appearance of the renal mass in the combination with gross hematuria. 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 The patient needs a diagnostic ureteroscopy and biopsy to answer this question. I went through the procedure with the patient detail, will try to get this done ASAP.

## 2019-08-04 NOTE — Anesthesia Postprocedure Evaluation (Signed)
Anesthesia Post Note  Patient: MUSTAF FOUTCH  Procedure(s) Performed: CYSTOSCOPY WITH BILATERAL  RETROGRADE PYELOGRAM, LEFT DIAGNOSTIC URETEROSCOPY  AND POSSIBLE BIOPSY AND STENT PLACEMENT (Bilateral Ureter)     Patient location during evaluation: PACU Anesthesia Type: General Level of consciousness: awake and alert and oriented Pain management: pain level controlled Vital Signs Assessment: post-procedure vital signs reviewed and stable Respiratory status: spontaneous breathing, nonlabored ventilation and respiratory function stable Cardiovascular status: blood pressure returned to baseline Postop Assessment: no apparent nausea or vomiting Anesthetic complications: no    Last Vitals:  Vitals:   08/04/19 0915 08/04/19 0930  BP: 129/88 123/77  Pulse: 74 67  Resp: 15 (!) 26  Temp:    SpO2: 99% 99%    Last Pain:  Vitals:   08/04/19 0859  TempSrc:   PainSc: 0-No pain                 Brennan Bailey

## 2019-08-04 NOTE — Interval H&P Note (Signed)
History and Physical Interval Note:  08/04/2019 7:35 AM  Thomas Baldwin  has presented today for surgery, with the diagnosis of GROSS HEMATURIA.  The various methods of treatment have been discussed with the patient and family. After consideration of risks, benefits and other options for treatment, the patient has consented to  Procedure(s): CYSTOSCOPY WITH BILATERAL  RETROGRADE PYELOGRAM, LEFT DIAGNOSTIC URETEROSCOPY  AND POSSIBLE BIOPSY AND STENT PLACEMENT (Bilateral) as a surgical intervention.  The patient's history has been reviewed, patient examined, no change in status, stable for surgery.  I have reviewed the patient's chart and labs.  Questions were answered to the patient's satisfaction.     Ardis Hughs

## 2019-08-08 ENCOUNTER — Other Ambulatory Visit: Payer: Self-pay | Admitting: Urology

## 2019-08-11 NOTE — Patient Instructions (Addendum)
DUE TO COVID-19 ONLY ONE VISITOR IS ALLOWED TO COME WITH YOU AND STAY IN THE WAITING ROOM ONLY DURING PRE OP AND PROCEDURE DAY OF SURGERY. THE 1 VISITOR MAY VISIT WITH YOU AFTER SURGERY IN YOUR PRIVATE ROOM DURING VISITING HOURS ONLY!  YOU NEED TO HAVE A COVID 19 TEST ON: 08/21/19 @        , THIS TEST MUST BE DONE BEFORE SURGERY, COME  Thomas Baldwin, [address] , [id number].  ([place]) ONCE YOUR COVID TEST IS COMPLETED, PLEASE BEGIN THE QUARANTINE INSTRUCTIONS AS OUTLINED IN YOUR HANDOUT.                Thomas Baldwin     Your procedure is scheduled on: 08/24/19   Report to [facility] Main  Entrance   Report to [place] at: 5:30 AM     Call this number if you have problems the morning of surgery [phone]    Remember: Do not eat food or drink liquids after Midnight.    BRUSH YOUR TEETH MORNING OF SURGERY AND RINSE YOUR MOUTH OUT, NO CHEWING GUM CANDY OR MINTS.     Take these medicines the morning of surgery with A SIP OF WATER: AMLODIPINE,METOPROLOL,OMEPRAZOLE                                 You may not have any metal on your body including               piercings  Do not wear jewelry,lotions, powders or  deodorant             Men may shave face and neck.   Do not bring valuables to the hospital. [place].  Contacts, dentures or bridgework may not be worn into surgery.                  Please read over the following fact sheets you were given: _____________________________________________________________________             [facility] - Preparing for Surgery Before surgery, you can play an important role.   Because skin is not sterile, your skin needs to be as free of germs as possible.   You can reduce the number of germs on your skin by washing with CHG (chlorahexidine gluconate) soap before surgery.   CHG is an antiseptic cleaner which kills germs and bonds with the skin to continue  killing germs even after washing. Please DO NOT use if you have an allergy to CHG or antibacterial soaps.   If your skin becomes reddened/irritated stop using the CHG and inform your nurse when you arrive at Short Stay.  You may shave your face/neck.  Please follow these instructions carefully:  1.  Shower with CHG Soap the night before surgery and the  morning of Surgery.  2.  If you choose to wash your hair, wash your hair first as usual with your  normal  shampoo.  3.  After you shampoo, rinse your hair and body thoroughly to remove the  shampoo.                                        4.  Use  CHG as you would any other liquid soap.  You can apply chg directly  to the skin and wash                       Gently with a scrungie or clean washcloth.  5.  Apply the CHG Soap to your body ONLY FROM THE NECK DOWN.   Do not use on face/ open                           Wound or open sores. Avoid contact with eyes, ears mouth and genitals (private parts).                       Wash face,  Genitals (private parts) with your normal soap.             6.  Wash thoroughly, paying special attention to the area where your surgery  will be performed.  7.  Thoroughly rinse your body with warm water from the neck down.  8.  DO NOT shower/wash with your normal soap after using and rinsing off  the CHG Soap.             9.  Pat yourself dry with a clean towel.            10.  Wear clean pajamas.            11.  Place clean sheets on your bed the night of your first shower and do not  sleep with pets. Day of Surgery : Do not apply any lotions/deodorants the morning of surgery.  Please wear clean clothes to the hospital/surgery center.  FAILURE TO FOLLOW THESE INSTRUCTIONS MAY RESULT IN THE CANCELLATION OF YOUR SURGERY PATIENT SIGNATURE_________________________________  NURSE SIGNATURE__________________________________  ________________________________________________________________________

## 2019-08-14 ENCOUNTER — Encounter (HOSPITAL_COMMUNITY): Payer: Self-pay

## 2019-08-14 ENCOUNTER — Encounter (HOSPITAL_COMMUNITY)
Admission: RE | Admit: 2019-08-14 | Discharge: 2019-08-14 | Disposition: A | Discharge status: Home / Self Care | Payer: 59 | Source: Ambulatory Visit | Attending: Urology | Admitting: Urology

## 2019-08-14 ENCOUNTER — Other Ambulatory Visit: Payer: Self-pay

## 2019-08-14 DIAGNOSIS — Z01812 Encounter for preprocedural laboratory examination: Secondary | ICD-10-CM | POA: Diagnosis not present

## 2019-08-14 LAB — COMPREHENSIVE METABOLIC PANEL
ALT: 31 U/L (ref 0–44)
AST: 25 U/L (ref 15–41)
Albumin: 4.3 g/dL (ref 3.5–5.0)
Alkaline Phosphatase: 63 U/L (ref 38–126)
Anion gap: 10 (ref 5–15)
BUN: 17 mg/dL (ref 8–23)
CO2: 30 mmol/L (ref 22–32)
Calcium: 9.6 mg/dL (ref 8.9–10.3)
Chloride: 102 mmol/L (ref 98–111)
Creatinine, Ser: 1.06 mg/dL (ref 0.61–1.24)
GFR calc Af Amer: >60 mL/min (ref >60)
GFR calc non Af Amer: >60 mL/min (ref >60)
Glucose, Bld: 114 mg/dL — ABNORMAL HIGH (ref 70–99)
Potassium: 4.5 mmol/L (ref 3.5–5.1)
Sodium: 142 mmol/L (ref 135–145)
Total Bilirubin: 1.2 mg/dL (ref 0.3–1.2)
Total Protein: 7.5 g/dL (ref 6.5–8.1)

## 2019-08-14 LAB — CBC
HCT: 43.4 % (ref 39.0–52.0)
Hemoglobin: 14.9 g/dL (ref 13.0–17.0)
MCH: 32.4 pg (ref 26.0–34.0)
MCHC: 34.3 g/dL (ref 30.0–36.0)
MCV: 94.3 fL (ref 80.0–100.0)
Platelets: 197 10*3/uL (ref 150–400)
RBC: 4.6 MIL/uL (ref 4.22–5.81)
RDW: 11.9 % (ref 11.5–15.5)
WBC: 6.4 10*3/uL (ref 4.0–10.5)
nRBC: 0 % (ref 0.0–0.2)

## 2019-08-14 LAB — ABO/RH: ABO/RH(D): AB POS

## 2019-08-14 NOTE — Progress Notes (Addendum)
PCP - Dr. Isaac Bliss Cardiologist - Dr. Rae Halsted  Chest x-ray - no EKG - 07/22/19 Afib Stress Test - no ECHO - 2013 Cardiac Cath - no  Sleep Study - no CPAP -   Fasting Blood Sugar - NA Checks Blood Sugar _____ times a day  Blood Thinner Instructions:ASA Aspirin Instructions:Dr. Herrick's office said to stop 5 days prior to DOS. Last Dose:2/6  Anesthesia review:   Patient denies shortness of breath, fever, cough and chest pain at PAT appointment yes  Patient verbalized understanding of instructions that were given to them at the PAT appointment. Patient was also instructed that they will need to review over the PAT instructions again at home before surgery. yes

## 2019-08-18 ENCOUNTER — Encounter (HOSPITAL_COMMUNITY): Payer: 59

## 2019-08-18 ENCOUNTER — Other Ambulatory Visit (HOSPITAL_COMMUNITY): Payer: 59

## 2019-08-21 ENCOUNTER — Other Ambulatory Visit (HOSPITAL_COMMUNITY)
Admission: RE | Admit: 2019-08-21 | Discharge: 2019-08-21 | Disposition: A | Discharge status: Home / Self Care | Payer: 59 | Source: Ambulatory Visit | Attending: Urology | Admitting: Urology

## 2019-08-21 ENCOUNTER — Ambulatory Visit (HOSPITAL_COMMUNITY): Admit: 2019-08-21 | Payer: 59 | Admitting: Internal Medicine

## 2019-08-21 ENCOUNTER — Encounter (HOSPITAL_COMMUNITY): Payer: Self-pay

## 2019-08-21 ENCOUNTER — Other Ambulatory Visit (HOSPITAL_COMMUNITY): Payer: 59

## 2019-08-21 ENCOUNTER — Other Ambulatory Visit: Payer: Self-pay

## 2019-08-21 LAB — SARS CORONAVIRUS 2 (TAT 6-24 HRS): SARS Coronavirus 2: NEGATIVE

## 2019-08-21 SURGERY — COLONOSCOPY WITH PROPOFOL
Anesthesia: Monitor Anesthesia Care

## 2019-08-23 ENCOUNTER — Encounter (HOSPITAL_COMMUNITY): Payer: Self-pay | Admitting: Urology

## 2019-08-24 ENCOUNTER — Encounter (HOSPITAL_COMMUNITY): Payer: Self-pay | Admitting: Urology

## 2019-08-24 ENCOUNTER — Inpatient Hospital Stay (HOSPITAL_COMMUNITY): Payer: 59 | Admitting: Anesthesiology

## 2019-08-24 ENCOUNTER — Other Ambulatory Visit: Payer: Self-pay

## 2019-08-24 ENCOUNTER — Inpatient Hospital Stay (HOSPITAL_COMMUNITY): Payer: 59 | Admitting: Physician Assistant

## 2019-08-24 ENCOUNTER — Inpatient Hospital Stay (HOSPITAL_COMMUNITY)
Admission: RE | Admit: 2019-08-24 | Discharge: 2019-08-25 | DRG: 658 | Disposition: A | Discharge status: Home / Self Care | Payer: 59 | Source: Other Acute Inpatient Hospital | Attending: Urology | Admitting: Urology

## 2019-08-24 ENCOUNTER — Encounter (HOSPITAL_COMMUNITY)
Admission: RE | Disposition: A | Discharge status: Home / Self Care | Payer: Self-pay | Source: Other Acute Inpatient Hospital | Attending: Urology

## 2019-08-24 DIAGNOSIS — Z79891 Long term (current) use of opiate analgesic: Secondary | ICD-10-CM | POA: Diagnosis not present

## 2019-08-24 DIAGNOSIS — Z20822 Contact with and (suspected) exposure to covid-19: Secondary | ICD-10-CM | POA: Diagnosis present

## 2019-08-24 DIAGNOSIS — F17210 Nicotine dependence, cigarettes, uncomplicated: Secondary | ICD-10-CM | POA: Diagnosis present

## 2019-08-24 DIAGNOSIS — N2889 Other specified disorders of kidney and ureter: Secondary | ICD-10-CM | POA: Diagnosis present

## 2019-08-24 DIAGNOSIS — I4891 Unspecified atrial fibrillation: Secondary | ICD-10-CM | POA: Diagnosis present

## 2019-08-24 DIAGNOSIS — C642 Malignant neoplasm of left kidney, except renal pelvis: Secondary | ICD-10-CM | POA: Diagnosis present

## 2019-08-24 DIAGNOSIS — K219 Gastro-esophageal reflux disease without esophagitis: Secondary | ICD-10-CM | POA: Diagnosis present

## 2019-08-24 DIAGNOSIS — Z79899 Other long term (current) drug therapy: Secondary | ICD-10-CM | POA: Diagnosis not present

## 2019-08-24 DIAGNOSIS — I10 Essential (primary) hypertension: Secondary | ICD-10-CM | POA: Diagnosis present

## 2019-08-24 DIAGNOSIS — Z7982 Long term (current) use of aspirin: Secondary | ICD-10-CM | POA: Diagnosis not present

## 2019-08-24 DIAGNOSIS — Z960 Presence of urogenital implants: Secondary | ICD-10-CM | POA: Diagnosis present

## 2019-08-24 DIAGNOSIS — R319 Hematuria, unspecified: Secondary | ICD-10-CM | POA: Diagnosis present

## 2019-08-24 HISTORY — PX: LAPAROSCOPIC NEPHRECTOMY, HAND ASSISTED: SHX1929

## 2019-08-24 LAB — TYPE AND SCREEN
ABO/RH(D): AB POS
Antibody Screen: NEGATIVE

## 2019-08-24 LAB — HEMOGLOBIN AND HEMATOCRIT, BLOOD
HCT: 42.7 % (ref 39.0–52.0)
Hemoglobin: 14.5 g/dL (ref 13.0–17.0)

## 2019-08-24 LAB — GLUCOSE, CAPILLARY: Glucose-Capillary: 105 mg/dL — ABNORMAL HIGH (ref 70–99)

## 2019-08-24 SURGERY — NEPHRECTOMY, HAND-ASSISTED, LAPAROSCOPIC
Anesthesia: General | Laterality: Left

## 2019-08-24 MED ORDER — ONDANSETRON HCL 4 MG/2ML IJ SOLN
INTRAMUSCULAR | Status: AC
  Filled 2019-08-24: qty 2

## 2019-08-24 MED ORDER — PHENYLEPHRINE 40 MCG/ML (10ML) SYRINGE FOR IV PUSH (FOR BLOOD PRESSURE SUPPORT)
PREFILLED_SYRINGE | INTRAVENOUS | Status: AC
  Filled 2019-08-24: qty 10

## 2019-08-24 MED ORDER — GLYCOPYRROLATE PF 0.2 MG/ML IJ SOSY
PREFILLED_SYRINGE | INTRAMUSCULAR | Status: AC
  Filled 2019-08-24: qty 2

## 2019-08-24 MED ORDER — DIPHENHYDRAMINE HCL 50 MG/ML IJ SOLN: 12.5000 mg | Freq: Four times a day (QID) | INTRAMUSCULAR | Status: DC | PRN

## 2019-08-24 MED ORDER — GLYCOPYRROLATE PF 0.2 MG/ML IJ SOSY
PREFILLED_SYRINGE | INTRAMUSCULAR | Status: DC | PRN
  Administered 2019-08-24: .2 mg via INTRAVENOUS

## 2019-08-24 MED ORDER — PHENYLEPHRINE HCL-NACL 10-0.9 MG/250ML-% IV SOLN
INTRAVENOUS | Status: AC
  Filled 2019-08-24: qty 250

## 2019-08-24 MED ORDER — LACTATED RINGERS IV SOLN: INTRAVENOUS | Status: DC

## 2019-08-24 MED ORDER — DIPHENHYDRAMINE HCL 12.5 MG/5ML PO ELIX: 12.5000 mg | ORAL_SOLUTION | Freq: Four times a day (QID) | ORAL | Status: DC | PRN

## 2019-08-24 MED ORDER — SODIUM CHLORIDE (PF) 0.9 % IJ SOLN
INTRAMUSCULAR | Status: AC
  Filled 2019-08-24: qty 10

## 2019-08-24 MED ORDER — ONDANSETRON HCL 4 MG/2ML IJ SOLN: 4.0000 mg | INTRAMUSCULAR | Status: DC | PRN

## 2019-08-24 MED ORDER — PHENYLEPHRINE 40 MCG/ML (10ML) SYRINGE FOR IV PUSH (FOR BLOOD PRESSURE SUPPORT)
PREFILLED_SYRINGE | INTRAVENOUS | Status: DC | PRN
  Administered 2019-08-24: 80 ug via INTRAVENOUS

## 2019-08-24 MED ORDER — ACETAMINOPHEN 10 MG/ML IV SOLN
1000.0000 mg | Freq: Four times a day (QID) | INTRAVENOUS | Status: AC
  Administered 2019-08-24 – 2019-08-25 (×4): 1000 mg via INTRAVENOUS
  Filled 2019-08-24 (×4): qty 100

## 2019-08-24 MED ORDER — FENTANYL CITRATE (PF) 100 MCG/2ML IJ SOLN
INTRAMUSCULAR | Status: AC
  Filled 2019-08-24: qty 4

## 2019-08-24 MED ORDER — OXYBUTYNIN CHLORIDE 5 MG PO TABS: 5.0000 mg | ORAL_TABLET | Freq: Three times a day (TID) | ORAL | Status: DC | PRN

## 2019-08-24 MED ORDER — PANTOPRAZOLE SODIUM 40 MG PO TBEC
40.0000 mg | DELAYED_RELEASE_TABLET | Freq: Every day | ORAL | Status: DC
  Administered 2019-08-25: 40 mg via ORAL
  Filled 2019-08-24: qty 1

## 2019-08-24 MED ORDER — HYDROMORPHONE HCL 1 MG/ML IJ SOLN: 0.5000 mg | INTRAMUSCULAR | Status: DC | PRN

## 2019-08-24 MED ORDER — KETOROLAC TROMETHAMINE 15 MG/ML IJ SOLN
15.0000 mg | Freq: Four times a day (QID) | INTRAMUSCULAR | Status: DC | PRN
  Administered 2019-08-24: 15 mg via INTRAVENOUS

## 2019-08-24 MED ORDER — FENTANYL CITRATE (PF) 250 MCG/5ML IJ SOLN
INTRAMUSCULAR | Status: AC
  Filled 2019-08-24: qty 5

## 2019-08-24 MED ORDER — FENTANYL CITRATE (PF) 100 MCG/2ML IJ SOLN
25.0000 ug | INTRAMUSCULAR | Status: DC | PRN
  Administered 2019-08-24: 50 ug via INTRAVENOUS

## 2019-08-24 MED ORDER — BUPIVACAINE LIPOSOME 1.3 % IJ SUSP
20.0000 mL | Freq: Once | INTRAMUSCULAR | Status: AC
  Administered 2019-08-24: 11:00:00 20 mL
  Filled 2019-08-24: qty 20

## 2019-08-24 MED ORDER — EPHEDRINE 5 MG/ML INJ
INTRAVENOUS | Status: AC
  Filled 2019-08-24: qty 10

## 2019-08-24 MED ORDER — GLYCOPYRROLATE PF 0.2 MG/ML IJ SOSY
PREFILLED_SYRINGE | INTRAMUSCULAR | Status: AC
  Filled 2019-08-24: qty 1

## 2019-08-24 MED ORDER — TRAMADOL HCL 50 MG PO TABS: 50.0000 mg | ORAL_TABLET | Freq: Four times a day (QID) | ORAL | 0 refills | Status: DC | PRN

## 2019-08-24 MED ORDER — LIDOCAINE 2% (20 MG/ML) 5 ML SYRINGE
INTRAMUSCULAR | Status: AC
  Filled 2019-08-24: qty 5

## 2019-08-24 MED ORDER — BUPIVACAINE-EPINEPHRINE 0.5% -1:200000 IJ SOLN
INTRAMUSCULAR | Status: AC
  Filled 2019-08-24: qty 1

## 2019-08-24 MED ORDER — ALBUMIN HUMAN 5 % IV SOLN: INTRAVENOUS | Status: DC | PRN

## 2019-08-24 MED ORDER — FENTANYL CITRATE (PF) 100 MCG/2ML IJ SOLN
INTRAMUSCULAR | Status: DC | PRN
  Administered 2019-08-24: 100 ug via INTRAVENOUS
  Administered 2019-08-24: 25 ug via INTRAVENOUS
  Administered 2019-08-24: 50 ug via INTRAVENOUS
  Administered 2019-08-24: 25 ug via INTRAVENOUS
  Administered 2019-08-24: 50 ug via INTRAVENOUS

## 2019-08-24 MED ORDER — LIDOCAINE 2% (20 MG/ML) 5 ML SYRINGE
INTRAMUSCULAR | Status: DC | PRN
  Administered 2019-08-24: 1 mg/kg/h via INTRAVENOUS
  Administered 2019-08-24: 100 mg via INTRAVENOUS

## 2019-08-24 MED ORDER — DEXAMETHASONE SODIUM PHOSPHATE 10 MG/ML IJ SOLN
INTRAMUSCULAR | Status: DC | PRN
  Administered 2019-08-24: 10 mg via INTRAVENOUS

## 2019-08-24 MED ORDER — PROPOFOL 10 MG/ML IV BOLUS
INTRAVENOUS | Status: DC | PRN
  Administered 2019-08-24: 200 mg via INTRAVENOUS

## 2019-08-24 MED ORDER — OXYCODONE HCL 5 MG/5ML PO SOLN: 5.0000 mg | Freq: Once | ORAL | Status: DC | PRN

## 2019-08-24 MED ORDER — PROPOFOL 10 MG/ML IV BOLUS
INTRAVENOUS | Status: AC
  Filled 2019-08-24: qty 20

## 2019-08-24 MED ORDER — SODIUM CHLORIDE (PF) 0.9 % IJ SOLN
INTRAMUSCULAR | Status: AC
  Filled 2019-08-24: qty 50

## 2019-08-24 MED ORDER — KETAMINE HCL 10 MG/ML IJ SOLN
INTRAMUSCULAR | Status: AC
  Filled 2019-08-24: qty 1

## 2019-08-24 MED ORDER — BUPIVACAINE-EPINEPHRINE 0.5% -1:200000 IJ SOLN
INTRAMUSCULAR | Status: DC | PRN
  Administered 2019-08-24: 15 mL

## 2019-08-24 MED ORDER — MIDAZOLAM HCL 5 MG/5ML IJ SOLN
INTRAMUSCULAR | Status: DC | PRN
  Administered 2019-08-24: 2 mg via INTRAVENOUS

## 2019-08-24 MED ORDER — ONDANSETRON HCL 4 MG/2ML IJ SOLN: 4.0000 mg | Freq: Once | INTRAMUSCULAR | Status: DC | PRN

## 2019-08-24 MED ORDER — EPHEDRINE SULFATE-NACL 50-0.9 MG/10ML-% IV SOSY
PREFILLED_SYRINGE | INTRAVENOUS | Status: DC | PRN
  Administered 2019-08-24 (×2): 10 mg via INTRAVENOUS

## 2019-08-24 MED ORDER — KETAMINE HCL 10 MG/ML IJ SOLN
INTRAMUSCULAR | Status: DC | PRN
  Administered 2019-08-24: 30 mg via INTRAVENOUS
  Administered 2019-08-24: 10 mg via INTRAVENOUS

## 2019-08-24 MED ORDER — LACTATED RINGERS IV SOLN: INTRAVENOUS | Status: DC | PRN

## 2019-08-24 MED ORDER — ROCURONIUM BROMIDE 10 MG/ML (PF) SYRINGE
PREFILLED_SYRINGE | INTRAVENOUS | Status: DC | PRN
  Administered 2019-08-24 (×2): 10 mg via INTRAVENOUS
  Administered 2019-08-24: 30 mg via INTRAVENOUS
  Administered 2019-08-24: 20 mg via INTRAVENOUS
  Administered 2019-08-24: 10 mg via INTRAVENOUS
  Administered 2019-08-24: 70 mg via INTRAVENOUS
  Administered 2019-08-24: 10 mg via INTRAVENOUS

## 2019-08-24 MED ORDER — ROCURONIUM BROMIDE 10 MG/ML (PF) SYRINGE
PREFILLED_SYRINGE | INTRAVENOUS | Status: AC
  Filled 2019-08-24: qty 10

## 2019-08-24 MED ORDER — SUGAMMADEX SODIUM 200 MG/2ML IV SOLN
INTRAVENOUS | Status: DC | PRN
  Administered 2019-08-24: 300 mg via INTRAVENOUS

## 2019-08-24 MED ORDER — KETOROLAC TROMETHAMINE 15 MG/ML IJ SOLN
INTRAMUSCULAR | Status: AC
  Filled 2019-08-24: qty 1

## 2019-08-24 MED ORDER — AMLODIPINE BESYLATE 5 MG PO TABS
5.0000 mg | ORAL_TABLET | Freq: Every day | ORAL | Status: DC
  Administered 2019-08-25: 5 mg via ORAL
  Filled 2019-08-24: qty 1

## 2019-08-24 MED ORDER — TRAMADOL HCL 50 MG PO TABS: 50.0000 mg | ORAL_TABLET | Freq: Four times a day (QID) | ORAL | Status: DC | PRN

## 2019-08-24 MED ORDER — OXYCODONE HCL 5 MG PO TABS: 5.0000 mg | ORAL_TABLET | Freq: Once | ORAL | Status: DC | PRN

## 2019-08-24 MED ORDER — ALBUMIN HUMAN 5 % IV SOLN
INTRAVENOUS | Status: AC
  Filled 2019-08-24: qty 250

## 2019-08-24 MED ORDER — MIDAZOLAM HCL 2 MG/2ML IJ SOLN
INTRAMUSCULAR | Status: AC
  Filled 2019-08-24: qty 2

## 2019-08-24 MED ORDER — DEXAMETHASONE SODIUM PHOSPHATE 10 MG/ML IJ SOLN
INTRAMUSCULAR | Status: AC
  Filled 2019-08-24: qty 1

## 2019-08-24 MED ORDER — METOPROLOL TARTRATE 50 MG PO TABS
50.0000 mg | ORAL_TABLET | Freq: Two times a day (BID) | ORAL | Status: DC
  Administered 2019-08-24 – 2019-08-25 (×2): 50 mg via ORAL
  Filled 2019-08-24 (×2): qty 1

## 2019-08-24 MED ORDER — CEFAZOLIN SODIUM-DEXTROSE 2-4 GM/100ML-% IV SOLN
2.0000 g | INTRAVENOUS | Status: AC
  Administered 2019-08-24: 08:00:00 2 g via INTRAVENOUS
  Filled 2019-08-24: qty 100

## 2019-08-24 MED ORDER — SODIUM CHLORIDE (PF) 0.9 % IJ SOLN
INTRAMUSCULAR | Status: DC | PRN
  Administered 2019-08-24: 55 mL

## 2019-08-24 MED ORDER — PROPOFOL 10 MG/ML IV BOLUS
INTRAVENOUS | Status: AC
  Filled 2019-08-24: qty 40

## 2019-08-24 MED ORDER — TRAZODONE HCL 50 MG PO TABS: 75.0000 mg | ORAL_TABLET | Freq: Every evening | ORAL | Status: DC | PRN

## 2019-08-24 MED ORDER — SENNOSIDES-DOCUSATE SODIUM 8.6-50 MG PO TABS
2.0000 | ORAL_TABLET | Freq: Every day | ORAL | Status: DC
  Administered 2019-08-24: 2 via ORAL
  Filled 2019-08-24: qty 2

## 2019-08-24 MED ORDER — BACITRACIN-NEOMYCIN-POLYMYXIN 400-5-5000 EX OINT: 1.0000 "application " | TOPICAL_OINTMENT | Freq: Three times a day (TID) | CUTANEOUS | Status: DC | PRN

## 2019-08-24 MED ORDER — ONDANSETRON HCL 4 MG/2ML IJ SOLN
INTRAMUSCULAR | Status: DC | PRN
  Administered 2019-08-24: 4 mg via INTRAVENOUS

## 2019-08-24 MED ORDER — PHENYLEPHRINE HCL-NACL 10-0.9 MG/250ML-% IV SOLN
INTRAVENOUS | Status: DC | PRN
  Administered 2019-08-24: 25 ug/min via INTRAVENOUS

## 2019-08-24 MED ORDER — LIDOCAINE HCL 2 % IJ SOLN
INTRAMUSCULAR | Status: AC
  Filled 2019-08-24: qty 20

## 2019-08-24 SURGICAL SUPPLY — 73 items
ADH SKN CLS APL DERMABOND .7 (GAUZE/BANDAGES/DRESSINGS) ×1
APL ESCP 34 STRL LF DISP (HEMOSTASIS)
APL PRP STRL LF DISP 70% ISPRP (MISCELLANEOUS) ×1
APL SRG 38 LTWT LNG FL B (MISCELLANEOUS)
APPLICATOR ARISTA FLEXITIP XL (MISCELLANEOUS) IMPLANT
APPLICATOR SURGIFLO ENDO (HEMOSTASIS) IMPLANT
APPLIER CLIP ROT 10 11.4 M/L (STAPLE)
APR CLP MED LRG 11.4X10 (STAPLE)
BAG LAPAROSCOPIC 12 15 PORT 16 (BASKET) IMPLANT
BAG RETRIEVAL 12/15 (BASKET) ×2
BAG SPEC THK2 15X12 ZIP CLS (MISCELLANEOUS)
BAG ZIPLOCK 12X15 (MISCELLANEOUS) ×1 IMPLANT
BLADE EXTENDED COATED 6.5IN (ELECTRODE) IMPLANT
BLADE SURG SZ10 CARB STEEL (BLADE) IMPLANT
CHLORAPREP W/TINT 26 (MISCELLANEOUS) ×2 IMPLANT
CLIP APPLIE ROT 10 11.4 M/L (STAPLE) IMPLANT
CLIP VESOLOCK LG 6/CT PURPLE (CLIP) ×2 IMPLANT
CLIP VESOLOCK MED LG 6/CT (CLIP) ×3 IMPLANT
CLIP VESOLOCK XL 6/CT (CLIP) ×2 IMPLANT
CNTNR URN SCR LID CUP LEK RST (MISCELLANEOUS) IMPLANT
CONT SPEC 4OZ STRL OR WHT (MISCELLANEOUS) ×2
COVER WAND RF STERILE (DRAPES) ×1 IMPLANT
CUTTER FLEX LINEAR 45M (STAPLE) ×1 IMPLANT
DECANTER SPIKE VIAL GLASS SM (MISCELLANEOUS) ×2 IMPLANT
DERMABOND ADVANCED (GAUZE/BANDAGES/DRESSINGS) ×1
DERMABOND ADVANCED .7 DNX12 (GAUZE/BANDAGES/DRESSINGS) ×1 IMPLANT
DRAPE INCISE IOBAN 66X45 STRL (DRAPES) ×2 IMPLANT
DRAPE WARM FLUID 44X44 (DRAPES) IMPLANT
ELECT REM PT RETURN 15FT ADLT (MISCELLANEOUS) ×2 IMPLANT
GLOVE BIOGEL M STRL SZ7.5 (GLOVE) ×2 IMPLANT
GOWN STRL REUS W/TWL LRG LVL3 (GOWN DISPOSABLE) ×4 IMPLANT
HEMOSTAT ARISTA ABSORB 3G PWDR (HEMOSTASIS) IMPLANT
HEMOSTAT SURGICEL 4X8 (HEMOSTASIS) ×1 IMPLANT
IRRIG SUCT STRYKERFLOW 2 WTIP (MISCELLANEOUS) ×2
IRRIGATION SUCT STRKRFLW 2 WTP (MISCELLANEOUS) ×1 IMPLANT
IV LACTATED RINGERS 1000ML (IV SOLUTION) ×1 IMPLANT
KIT BASIN OR (CUSTOM PROCEDURE TRAY) ×2 IMPLANT
KIT TURNOVER KIT A (KITS) ×1 IMPLANT
MANIFOLD NEPTUNE II (INSTRUMENTS) ×2 IMPLANT
NDL INSUFFLATION 14GA 120MM (NEEDLE) IMPLANT
NDL SPNL 22GX3.5 QUINCKE BK (NEEDLE) IMPLANT
NEEDLE INSUFFLATION 14GA 120MM (NEEDLE) ×2 IMPLANT
NEEDLE SPNL 22GX3.5 QUINCKE BK (NEEDLE) IMPLANT
NS IRRIG 1000ML POUR BTL (IV SOLUTION) ×1 IMPLANT
PAD POSITIONING PINK XL (MISCELLANEOUS) ×2 IMPLANT
PENCIL SMOKE EVACUATOR (MISCELLANEOUS) ×1 IMPLANT
PROTECTOR NERVE ULNAR (MISCELLANEOUS) ×4 IMPLANT
RELOAD 45 VASCULAR/THIN (ENDOMECHANICALS) ×8 IMPLANT
RELOAD STAPLE 45 2.5 WHT GRN (ENDOMECHANICALS) IMPLANT
RELOAD STAPLE 45 3.5 BLU ETS (ENDOMECHANICALS) IMPLANT
RELOAD STAPLE TA45 3.5 REG BLU (ENDOMECHANICALS) IMPLANT
SCISSORS LAP 5X35 DISP (ENDOMECHANICALS) IMPLANT
SET TUBE SMOKE EVAC HIGH FLOW (TUBING) ×2 IMPLANT
SHEARS HARMONIC ACE PLUS 36CM (ENDOMECHANICALS) ×2 IMPLANT
SLEEVE XCEL OPT CAN 5 100 (ENDOMECHANICALS) ×2 IMPLANT
SPONGE LAP 4X18 RFD (DISPOSABLE) IMPLANT
SUT MNCRL AB 4-0 PS2 18 (SUTURE) ×4 IMPLANT
SUT PDS AB 0 CT1 36 (SUTURE) ×2 IMPLANT
SUT VIC AB 0 CT1 27 (SUTURE) ×2
SUT VIC AB 0 CT1 27XBRD ANTBC (SUTURE) IMPLANT
SUT VIC AB 2-0 CT1 27 (SUTURE)
SUT VIC AB 2-0 CT1 27XBRD (SUTURE) IMPLANT
SUT VICRYL 0 UR6 27IN ABS (SUTURE) ×2 IMPLANT
SYS LAPSCP GELPORT 120MM (MISCELLANEOUS)
SYSTEM LAPSCP GELPORT 120MM (MISCELLANEOUS) IMPLANT
TAPE CLOTH 4X10 WHT NS (GAUZE/BANDAGES/DRESSINGS) ×1 IMPLANT
TOWEL OR 17X26 10 PK STRL BLUE (TOWEL DISPOSABLE) ×2 IMPLANT
TOWEL OR NON WOVEN STRL DISP B (DISPOSABLE) ×1 IMPLANT
TRAY FOLEY MTR SLVR 16FR STAT (SET/KITS/TRAYS/PACK) ×2 IMPLANT
TRAY LAPAROSCOPIC (CUSTOM PROCEDURE TRAY) ×2 IMPLANT
TROCAR BLADELESS OPT 5 100 (ENDOMECHANICALS) ×2 IMPLANT
TROCAR UNIVERSAL OPT 12M 100M (ENDOMECHANICALS) ×2 IMPLANT
TROCAR XCEL 12X100 BLDLESS (ENDOMECHANICALS) ×2 IMPLANT

## 2019-08-24 NOTE — Anesthesia Postprocedure Evaluation (Signed)
Anesthesia Post Note  Patient: Thomas Baldwin  Procedure(s) Performed: LAPAROSCOPIC LEFT NEPHRECTOMY (Left )     Patient location during evaluation: PACU Anesthesia Type: General Level of consciousness: awake and alert Pain management: pain level controlled Vital Signs Assessment: post-procedure vital signs reviewed and stable Respiratory status: spontaneous breathing, nonlabored ventilation and respiratory function stable Cardiovascular status: blood pressure returned to baseline and stable Postop Assessment: no apparent nausea or vomiting Anesthetic complications: no    Last Vitals:  Vitals:   08/24/19 1300 08/24/19 1322  BP: 122/83 (!) 129/91  Pulse: 93 73  Resp: 13 16  Temp:  (!) 36.3 C  SpO2: 93% 93%    Last Pain:  Vitals:   08/24/19 1600  TempSrc:   PainSc: [name]

## 2019-08-24 NOTE — Interval H&P Note (Signed)
History and Physical Interval Note:  08/24/2019 7:32 AM  Thomas Baldwin  has presented today for surgery, with the diagnosis of LEFT RENAL MASS.  The various methods of treatment have been discussed with the patient and family. After consideration of risks, benefits and other options for treatment, the patient has consented to  Procedure(s): HAND ASSISTED LAPAROSCOPIC NEPHRECTOMY (Left) as a surgical intervention.  The patient's history has been reviewed, patient examined, no change in status, stable for surgery.  I have reviewed the patient's chart and labs.  Questions were answered to the patient's satisfaction.     Ardis Hughs

## 2019-08-24 NOTE — Anesthesia Preprocedure Evaluation (Addendum)
Anesthesia Evaluation  Patient identified by MRN, date of birth, ID band Patient awake    Reviewed: Allergy & Precautions, NPO status , Patient's Chart, lab work & pertinent test results, reviewed documented beta blocker date and time   History of Anesthesia Complications Negative for: history of anesthetic complications  Airway Mallampati: II  TM Distance: >3 FB Neck ROM: Full    Dental  (+) Teeth Intact   Pulmonary neg pulmonary ROS, Current Smoker and Patient abstained from smoking.,    Pulmonary exam normal        Cardiovascular hypertension, Pt. on medications and Pt. on home beta blockers Normal cardiovascular exam+ dysrhythmias Atrial Fibrillation      Neuro/Psych negative neurological ROS  negative psych ROS   GI/Hepatic Neg liver ROS, GERD  ,  Endo/Other  negative endocrine ROS  Renal/GU Left renal mass  negative genitourinary   Musculoskeletal negative musculoskeletal ROS (+)   Abdominal   Peds  Hematology negative hematology ROS (+)   Anesthesia Other Findings   Reproductive/Obstetrics                           Anesthesia Physical Anesthesia Plan  ASA: III  Anesthesia Plan: General   Post-op Pain Management:    Induction: Intravenous  PONV Risk Score and Plan: 1 and Ondansetron, Dexamethasone, Treatment may vary due to age or medical condition and Midazolam  Airway Management Planned: Oral ETT  Additional Equipment: None  Intra-op Plan:   Post-operative Plan: Extubation in OR  Informed Consent: I have reviewed the patients History and Physical, chart, labs and discussed the procedure including the risks, benefits and alternatives for the proposed anesthesia with the patient or authorized representative who has indicated his/her understanding and acceptance.     Dental advisory given  Plan Discussed with:   Anesthesia Plan Comments: (Large bore PIV x2)        Anesthesia Quick Evaluation

## 2019-08-24 NOTE — Transfer of Care (Signed)
Immediate Anesthesia Transfer of Care Note  Patient: Thomas Baldwin  Procedure(s) Performed: Procedure(s): LAPAROSCOPIC LEFT NEPHRECTOMY (Left)  Patient Location: PACU  Anesthesia Type:General  Level of Consciousness:  sedated, patient cooperative and responds to stimulation  Airway & Oxygen Therapy:Patient Spontanous Breathing and Patient connected to face mask oxgen  Post-op Assessment:  Report given to PACU RN and Post -op Vital signs reviewed and stable  Post vital signs:  Reviewed and stable  Last Vitals:  Vitals:   08/24/19 0603  BP: (!) 122/92  Pulse: 77  Resp: 18  Temp: 36.7 C  SpO2: 0000000    Complications: No apparent anesthesia complications

## 2019-08-24 NOTE — H&P (Signed)
Renal Mass  HPI: Thomas Baldwin is a 64 year-old male established patient who is here further eval and management of a renal mass.  The mass is on the left side.   The lesion(s) was first noted on 07/22/2019. The mass was seen on CT Scan.   His symptoms include flank pain and blood in urine. Patient denies having back pain, groin pain, nausea, vomiting, fever, and chills. He has seen blood in his urine. He does have a good appetite. He is not having pain in new locations. He has not recently had unwanted weight loss.   The patient can walk a flight of steps.   The patient denies history of diabetes, heart attack or stroke. There is not a a family history of kidney cancer. There is no family history of brain tumors (AMLs), seizures or brain aneurysm's.   CT scan - 2.7x2.5x2.3 in central left kidney. The CT scan was obtained because the patient was have in left-sided renal colic like symptoms and hematuria. He has not had any ongoing hematuria or colic since he was released from the emergency department.   Interval: The patient presents today for further discussion. We went to the operating room for a diagnostic ureteroscopy. This demonstrated no collecting system involvement but there was some large vessels within the wall of the calices in the central region suggesting that was where his bleeding was coming from, but there was no tumor. There were no biopsies. I did place a stent, which he removed the following Monday without difficulty.     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   Tobacco Use Assessment Completed: Used Tobacco in last 30 days? Does not use smokeless tobacco. Does drink.  Does not use drugs. Drinks 1 caffeinated drink per day.    REVIEW OF SYSTEMS:    GU Review Male:   Patient denies frequent urination, hard to postpone urination, burning/ pain with urination, get up at night to urinate, leakage of urine, stream starts and stops, trouble starting your stream, have to strain to urinate , erection problems, and penile pain.  Gastrointestinal (Upper):   Patient denies nausea, vomiting, and indigestion/ heartburn.  Gastrointestinal (Lower):   Patient denies diarrhea and constipation.  Constitutional:   Patient denies fever, night sweats, weight loss, and fatigue.  Skin:   Patient denies skin rash/ lesion and itching.  Eyes:   Patient denies blurred vision and double vision.  Ears/ Nose/ Throat:   Patient denies sore throat and sinus problems.  Hematologic/Lymphatic:   Patient denies swollen glands and easy bruising.  Cardiovascular:   Patient denies leg swelling and chest pains.  Respiratory:   Patient denies cough and shortness of breath.  Endocrine:   Patient denies excessive thirst.  Musculoskeletal:   Patient denies  back pain and joint pain.  Neurological:   Patient denies headaches and dizziness.  Psychologic:   Patient denies depression and anxiety.   VITAL SIGNS:      08/14/2019 12:02 PM  Weight 240 lb / 108.86 kg  Height 78 in / 198.12 cm  BP 143/90 mmHg  Pulse 73 /min  Temperature 97.3 F / 36.2 C  BMI 27.7 kg/m   MULTI-SYSTEM PHYSICAL EXAMINATION:    Constitutional: Well-nourished. No physical deformities. Normally  developed. Good grooming.  Respiratory: Normal breath sounds. No labored breathing, no use of accessory muscles.   Cardiovascular: Regular rate and rhythm. No murmur, no gallop. Normal temperature, normal extremity pulses, no swelling, no varicosities.      PAST DATA REVIEWED:  Source Of History:  Patient  Records Review:   Pathology Reports, Previous Doctor Records, Previous Hospital Records, Previous Patient Records  Urine Test Review:   Urinalysis   PROCEDURES: None   ASSESSMENT:      ICD-10 Details  1 GU:   Benign Neo Kidney, Unspec - D30.00    PLAN:           Document Letter(s):  Created for Patient: Clinical Summary         Notes:   The patient has a small enhancing renal mass is very central, concerning for renal cell carcinoma. Unfortunately, given the location, I do not think this is amenable to a partial nephrectomy. As such, I recommended that we perform a laparoscopic nephrectomy. I went through the surgery as well as the alternative treatment options with the patient. He is scheduled already for a nephrectomy in the coming days. I went through the alternatives, expected outcomes, hospitalization, and recovery time. I answered all his questions. He has opted to proceed.

## 2019-08-24 NOTE — Anesthesia Procedure Notes (Signed)
Procedure Name: Intubation Date/Time: 08/24/2019 8:19 AM Performed by: Lavina Hamman, CRNA Pre-anesthesia Checklist: Patient identified, Emergency Drugs available, Suction available, Patient being monitored and Timeout performed Patient Re-evaluated:Patient Re-evaluated prior to induction Oxygen Delivery Method: Circle system utilized Preoxygenation: Pre-oxygenation with 100% oxygen Induction Type: IV induction Ventilation: Mask ventilation without difficulty and Oral airway inserted - appropriate to patient size Laryngoscope Size: Mac and 4 Grade View: Grade III Tube type: Oral Tube size: 8.0 mm Number of attempts: 2 Airway Equipment and Method: Stylet and Video-laryngoscopy Placement Confirmation: ETT inserted through vocal cords under direct vision,  positive ETCO2,  CO2 detector and breath sounds checked- equal and bilateral Secured at: 24 cm Tube secured with: Tape Dental Injury: Teeth and Oropharynx as per pre-operative assessment  Difficulty Due To: Difficulty was anticipated, Difficult Airway- due to dentition, Difficult Airway- due to limited oral opening and Difficult Airway- due to anterior larynx Comments: ATOI, easy mask with Oral AW, G3 with Mac4.   G1 with Glidescope4.

## 2019-08-24 NOTE — Progress Notes (Signed)
Urology Progress Note   Day of Surgery s/p lap left nephrectomy  Subjective: Pain controlled. Sleepy. Not gotten OOB. PACU Hgb stable   Objective: Vital signs in last 24 hours: Temp:  [97.3 F (36.3 C)-98.2 F (36.8 C)] 97.3 F (36.3 C) (02/11 1322) Pulse Rate:  [73-102] 73 (02/11 1322) Resp:  [12-21] 16 (02/11 1322) BP: (122-134)/(80-92) 129/91 (02/11 1322) SpO2:  [93 %-100 %] 93 % (02/11 1322) Weight:  [109.5 kg] 109.5 kg (02/11 0542)  Intake/Output from previous day: No intake/output data recorded. Intake/Output this shift: Total I/O In: 2350 [I.V.:2100; IV Piggyback:250] Out: 600 [Urine:500; Blood:100]  Physical Exam:  General: Alert and oriented CV: RRR Lungs: Normal work of breathing Abdomen: Soft, appropriately tender. Incisions c/d/i w/ dermabond. No hematoma GU: Foley in place draining clear yellow urine  Ext: NT, No erythema  Lab Results: Recent Labs    08/24/19 1207  HGB 14.5  HCT 42.7   BMET No results for input(s): NA, K, CL, CO2, GLUCOSE, BUN, CREATININE, CALCIUM in the last 72 hours.   Studies/Results: No results found.  Assessment/Plan:  64 y.o. male s/p lap left Nephrectomy.    - Continue current plan of care   Dispo: Floor   LOS: 0 days   Tharon Aquas 08/24/2019, 3:23 PM

## 2019-08-24 NOTE — Op Note (Signed)
Preoperative diagnosis:  1. Left renal mass   Postoperative diagnosis:  1. same   Procedure: 1. Laparoscopic left radical nephrectomy  Surgeon: Ardis Hughs, MD Resident Assistant: Marye Round, MD   Anesthesia: General  Complications: None  Intraoperative findings: two arteries with the main artery branching early.  Large lumbar vein (spared), adrenal gland spared.  EBL: 50cc  Specimens:  Left kidney and proximal ureter  Indication: Thomas Baldwin is a 64 y.o. patient with left central/endophytic renal mass who presented with hematuria.  After reviewing the management options for treatment, he elected to proceed with the above surgical procedure(s). We have discussed the potential benefits and risks of the procedure, side effects of the proposed treatment, the likelihood of the patient achieving the goals of the procedure, and any potential problems that might occur during the procedure or recuperation. Informed consent has been obtained.  Description of procedure:  The case was performed hand and hand with my first assistant, PA Debbrah Alar, and I could not do the case without her. She was involved from placing the ports, intraoperative procedure, and closing of the port incisions.  A site was selected lateral to the umbilicus for placement of the camera port. This was placed using a standard open Hassan technique which allowed entry into the peritoneal cavity under direct vision and without difficulty. A 12 mm Hassan cannula was placed and a pneumoperitoneum established. The camera was then used to inspect the abdomen and there was no evidence of any intra-abdominal injuries or other abnormalities. The remaining abdominal ports were then placed. One 5 mm trocar was placed subcostal margin in the left upper quadrant, the second 5 mm trocar was placed laterally to the camera port so as to triangulate the kidney. An assistant port was then placed in between the camera and the left  lateral port. The assistant port was a 12 mm port.   The white line of Toldt was incised allowing the colon to be mobilized medially and the plane between the mesocolon and the anterior layer of Gerota's fascia to be developed and the kidney exposed. The ureter and gonadal vein were identified inferiorly and the ureter was lifted anteriorly off the psoas muscle. The gonadal vein was then dissected out inferior to the lower pole and 2 clips were placed both superiorly and inferiorly and then ligated. A second 5 mm port was placed in the left lower quadrant to help facilitate lifting up of the kidney. Dissection proceeded superiorly along the gonadal vein until the renal vein was identified. The renal hilum was then carefully isolated with a combination of blunt and sharp dissectiong allowing the renal arterial and venous structures to be separated and isolated.   The renal artery was isolated and ligated with a 45 mm Flex ETS stapler.  Further dissection demonstrated a second artery, likely the branch of the main artery which was taken with a second staple load.  The smaller upper pole artery was clipped and ligated.   The renal vein was then isolated and also ligated and divided with a 45 mm Flex ETS stapler.     Gerota's fascia was intentionally entered superiorly and the space between the adrenal gland and the kidney was developed allowing the adrenal gland to be spared. A third staple load was then used which divided the adrenal gland. once the hilum had been ligated dissection ensued from the inferior pole of the kidney. The ureter was transected placing 2 clips on the stay side and one  on the specimen side. The lateral attachments of the kidney were then freed. Our attention was then turned to the upper pole which was dissected off of the spleen and the splenic attachments. The pancreas and colon were noted to be well away from the structures. The remaining of the posterior aspect of the attachments was  then ligated using a Harmonic Scalpel. Once the kidney was freed from its attachments it was shown to medially and the vascular hilum was inspected and noted to be sufficiently hemostatic. There was slight bit of oozing from the adrenal gland which was cauterized and then Surgicel placed over top. Insufflation was then turned down to 8 mm mercury and the kidney bed was noted to be hemostatic.   80cc of Exparel was then injected into the left anterior axillary line b/w the iliac crest and the twelfth rib under laparoscopic guidance. The layer between the tranversus abdominus and the internal oblique was targeted.   The kidney/ureter specimen was then placed into a 12 mm Endocatch II retrieval bag, this was passed through the port site of the assistant port and left lower quadrant. The trochars were then removed under visual guidance to ensure no ongoing port site bleeding was occurring. The extraction incision was extended from the 12 mm left lower quadrant port. The external oblique and internal oblique muscles were spread as best as possible with as little muscle fibers ligated as possible in order to safely extracted the specimen. The internal oblique flash of was then closed with 2-0 Vicryl in an interrupted figure-of-eight fashion. The external oblique fascia was closed with a 0 looped PDS. The camera port was then closed with 2-0 Vicryl the level of the fascia. All incisions were injected with Exparel and reapproximated at the skin with 4-0 monocryl sutures. Dermabond was applied to the skin. The patient tolerated the procedure well and without complications and was transferred to the recovery unit in satisfactory condition.

## 2019-08-25 LAB — BASIC METABOLIC PANEL
Anion gap: 8 (ref 5–15)
BUN: 16 mg/dL (ref 8–23)
CO2: 26 mmol/L (ref 22–32)
Calcium: 8.8 mg/dL — ABNORMAL LOW (ref 8.9–10.3)
Chloride: 102 mmol/L (ref 98–111)
Creatinine, Ser: 1.59 mg/dL — ABNORMAL HIGH (ref 0.61–1.24)
GFR calc Af Amer: 52 mL/min — ABNORMAL LOW (ref >60)
GFR calc non Af Amer: 45 mL/min — ABNORMAL LOW (ref >60)
Glucose, Bld: 120 mg/dL — ABNORMAL HIGH (ref 70–99)
Potassium: 4.4 mmol/L (ref 3.5–5.1)
Sodium: 136 mmol/L (ref 135–145)

## 2019-08-25 LAB — HEMOGLOBIN AND HEMATOCRIT, BLOOD
HCT: 38.5 % — ABNORMAL LOW (ref 39.0–52.0)
Hemoglobin: 13 g/dL (ref 13.0–17.0)

## 2019-08-25 NOTE — Progress Notes (Signed)
Pt to be discharged to home this afternoon. Pt and Pt's wife given discharge teaching including Medications and schedules of these Medications. Pt and Pt's Wife verbalized understanding of all discharge teaching/instructions. Discharge packet with Pt at time of discharge

## 2019-08-25 NOTE — Discharge Instructions (Signed)

## 2019-08-25 NOTE — Discharge Summary (Signed)
Date of admission: 08/24/2019  Date of discharge: 08/25/2019  Admission diagnosis: left renal mass  Discharge diagnosis: same  Secondary diagnoses:  Patient Active Problem List   Diagnosis Date Noted  . Kidney mass 08/24/2019  . Preventative health care 05/23/2019  . Impaired glucose tolerance 12/07/2014  . Atrial fibrillation ([place]) 01/08/2012  . Essential hypertension 03/16/2007    Procedures performed: Procedure(s): LAPAROSCOPIC LEFT NEPHRECTOMY  History and Physical: For full details, please see admission history and physical. Briefly, Thomas Baldwin is a 64 y.o. year old patient with bleeding endophytic/cental renal mass.   Hospital Course: Patient tolerated the procedure well.  He was then transferred to the floor after an uneventful PACU stay.  His hospital course was uncomplicated.  On POD#1 he had met discharge criteria: was eating a regular diet, was up and ambulating independently,  pain was well controlled, was voiding without a catheter, and was ready to for discharge.   Laboratory values:  Recent Labs    08/24/19 1207 08/25/19 0415  HGB 14.5 13.0  HCT 42.7 38.5*   Recent Labs    08/25/19 0415  NA 136  K 4.4  CL 102  CO2 26  GLUCOSE 120*  BUN 16  CREATININE 1.59*  CALCIUM 8.8*   No results for input(s): LABPT, INR in the last 72 hours. No results for input(s): LABURIN in the last 72 hours. Results for orders placed or performed during the hospital encounter of 08/21/19  SARS CORONAVIRUS 2 (TAT 6-24 HRS) Nasopharyngeal Nasopharyngeal Swab     Status: None   Collection Time: 08/21/19  7:15 AM   Specimen: Nasopharyngeal Swab  Result Value Ref Range Status   SARS Coronavirus 2 NEGATIVE NEGATIVE Final    Comment: (NOTE) SARS-CoV-2 target nucleic acids are NOT DETECTED. The SARS-CoV-2 RNA is generally detectable in upper and lower respiratory specimens during the acute phase of infection. Negative results do not preclude SARS-CoV-2 infection, do not rule  out co-infections with other pathogens, and should not be used as the sole basis for treatment or other patient management decisions. Negative results must be combined with clinical observations, patient history, and epidemiological information. The expected result is Negative. Fact Sheet for Patients: SugarRoll.be Fact Sheet for Healthcare Providers: https://www.woods-mathews.com/ This test is not yet approved or cleared by the [place] FDA and  has been authorized for detection and/or diagnosis of SARS-CoV-2 by FDA under an Emergency Use Authorization (EUA). This EUA will remain  in effect (meaning this test can be used) for the duration of the COVID-19 declaration under Section 56 4(b)(1) of the Act, 21 U.S.C. section 360bbb-3(b)(1), unless the authorization is terminated or revoked sooner. Performed at [facility], [place] [address] [place], [place] [address]     Disposition: Home  Discharge instruction: The patient was instructed to be ambulatory but told to refrain from heavy lifting, strenuous activity, or driving.   Discharge medications:  Allergies as of 08/25/2019   No Known Allergies     Medication List    STOP taking these medications   HYDROcodone-acetaminophen 5-325 MG tablet Commonly known as: NORCO/VICODIN   ondansetron 4 MG tablet Commonly known as: ZOFRAN   phenazopyridine 200 MG tablet Commonly known as: Pyridium   polyethylene glycol-electrolytes 420 g solution Commonly known as: TriLyte     TAKE these medications   acetaminophen 325 MG tablet Commonly known as: TYLENOL Take 975 mg by mouth every 6 (six) hours as needed for moderate pain or headache.   amLODipine 5  MG tablet Commonly known as: NORVASC Take 1 tablet by mouth once daily   aspirin 81 MG tablet Take 81 mg by mouth daily.   losartan 100 MG tablet Commonly known as: COZAAR Take 1 tablet by mouth once daily   metoprolol  tartrate 50 MG tablet Commonly known as: LOPRESSOR Take 1 tablet by mouth twice daily   MULTIVITAMIN ADULT PO Take by mouth daily.   omeprazole 20 MG capsule Commonly known as: PRILOSEC Take 1 capsule (20 mg total) by mouth daily.   traMADol 50 MG tablet Commonly known as: Ultram Take 1-2 tablets (50-100 mg total) by mouth every 6 (six) hours as needed for moderate pain.   traZODone 150 MG tablet Commonly known as: DESYREL TAKE 1 TABLET BY MOUTH AT BEDTIME       Followup:  Follow-up Information    Ardis Hughs, MD On 09/07/2019.   Specialty: Urology Why: 11:00am, post op check Contact information: [place] [address] [phone]

## 2019-08-25 NOTE — Progress Notes (Signed)
Urology Progress Note   1 Day Post-Op s/p lap left nephrectomy  Subjective: Did well overnight, minimal pain.  Trouble sleeping.  Asking about home.  Objective: Vital signs in last 24 hours: Temp:  [97.3 F (36.3 C)-98.3 F (36.8 C)] 97.5 F (36.4 C) (02/12 0605) Pulse Rate:  [55-102] 55 (02/12 0605) Resp:  [12-21] 17 (02/12 0605) BP: (122-138)/(76-91) 124/81 (02/12 0605) SpO2:  [93 %-100 %] 98 % (02/12 0605)  Intake/Output from previous day: 02/11 0701 - 02/12 0700 In: 3212.5 [I.V.:2862.5; IV Piggyback:350] Out: 2600 [Urine:2500; Blood:100] Intake/Output this shift: No intake/output data recorded.  Physical Exam:  General: Alert and oriented CV: RRR Lungs: Normal work of breathing Abdomen: Soft, appropriately tender. Incisions c/d/i w/ dermabond. No hematoma GU: Foley in place draining clear yellow urine  Ext: NT, No erythema  Lab Results: Recent Labs    08/24/19 1207 08/25/19 0415  HGB 14.5 13.0  HCT 42.7 38.5*   BMET Recent Labs    08/25/19 0415  NA 136  K 4.4  CL 102  CO2 26  GLUCOSE 120*  BUN 16  CREATININE 1.59*  CALCIUM 8.8*     Studies/Results: No results found.  Assessment/Plan:  64 y.o. male s/p lap left Nephrectomy.    -Stop fluids -Regular diet -Ambulate -Discontinue Foley trial void -If he does well this morning he will go home this afternoon.   Dispo: Floor   LOS: 1 day   Tharon Aquas 08/25/2019, 7:29 AM

## 2019-08-28 LAB — SURGICAL PATHOLOGY

## 2019-09-08 ENCOUNTER — Other Ambulatory Visit: Payer: Self-pay | Admitting: Internal Medicine

## 2019-09-08 DIAGNOSIS — I10 Essential (primary) hypertension: Secondary | ICD-10-CM

## 2019-09-30 ENCOUNTER — Other Ambulatory Visit: Payer: Self-pay | Admitting: Internal Medicine

## 2019-09-30 DIAGNOSIS — I10 Essential (primary) hypertension: Secondary | ICD-10-CM

## 2019-10-10 ENCOUNTER — Other Ambulatory Visit: Payer: Self-pay | Admitting: Internal Medicine

## 2019-10-10 DIAGNOSIS — I10 Essential (primary) hypertension: Secondary | ICD-10-CM

## 2019-10-24 ENCOUNTER — Other Ambulatory Visit: Payer: Self-pay | Admitting: Internal Medicine

## 2019-10-24 DIAGNOSIS — I10 Essential (primary) hypertension: Secondary | ICD-10-CM

## 2019-12-06 ENCOUNTER — Other Ambulatory Visit: Payer: Self-pay | Admitting: Internal Medicine

## 2019-12-06 DIAGNOSIS — I10 Essential (primary) hypertension: Secondary | ICD-10-CM

## 2019-12-29 ENCOUNTER — Other Ambulatory Visit (HOSPITAL_COMMUNITY): Payer: Self-pay | Admitting: Nurse Practitioner

## 2019-12-29 ENCOUNTER — Other Ambulatory Visit: Payer: Self-pay

## 2019-12-29 ENCOUNTER — Ambulatory Visit (HOSPITAL_COMMUNITY)
Admission: RE | Admit: 2019-12-29 | Discharge: 2019-12-29 | Disposition: A | Discharge status: Home / Self Care | Payer: 59 | Source: Ambulatory Visit | Attending: Nurse Practitioner | Admitting: Nurse Practitioner

## 2019-12-29 DIAGNOSIS — C642 Malignant neoplasm of left kidney, except renal pelvis: Secondary | ICD-10-CM | POA: Diagnosis not present

## 2020-01-13 ENCOUNTER — Other Ambulatory Visit: Payer: Self-pay | Admitting: Internal Medicine

## 2020-01-13 DIAGNOSIS — I10 Essential (primary) hypertension: Secondary | ICD-10-CM

## 2020-01-26 ENCOUNTER — Other Ambulatory Visit: Payer: Self-pay | Admitting: Internal Medicine

## 2020-01-26 DIAGNOSIS — I10 Essential (primary) hypertension: Secondary | ICD-10-CM

## 2020-02-08 ENCOUNTER — Encounter: Payer: Self-pay | Admitting: Internal Medicine

## 2020-02-13 ENCOUNTER — Other Ambulatory Visit: Payer: Self-pay | Admitting: Internal Medicine

## 2020-02-13 DIAGNOSIS — I10 Essential (primary) hypertension: Secondary | ICD-10-CM

## 2020-02-20 ENCOUNTER — Other Ambulatory Visit: Payer: Self-pay | Admitting: Internal Medicine

## 2020-02-20 DIAGNOSIS — I10 Essential (primary) hypertension: Secondary | ICD-10-CM

## 2020-02-21 ENCOUNTER — Other Ambulatory Visit: Payer: Self-pay

## 2020-02-21 ENCOUNTER — Telehealth (INDEPENDENT_AMBULATORY_CARE_PROVIDER_SITE_OTHER): Payer: 59 | Admitting: Internal Medicine

## 2020-02-21 DIAGNOSIS — R7302 Impaired glucose tolerance (oral): Secondary | ICD-10-CM | POA: Diagnosis not present

## 2020-02-21 DIAGNOSIS — I10 Essential (primary) hypertension: Secondary | ICD-10-CM

## 2020-02-21 DIAGNOSIS — N2889 Other specified disorders of kidney and ureter: Secondary | ICD-10-CM

## 2020-02-21 DIAGNOSIS — I480 Paroxysmal atrial fibrillation: Secondary | ICD-10-CM | POA: Diagnosis not present

## 2020-02-21 DIAGNOSIS — Z1211 Encounter for screening for malignant neoplasm of colon: Secondary | ICD-10-CM | POA: Diagnosis not present

## 2020-02-21 MED ORDER — LOSARTAN POTASSIUM 100 MG PO TABS: 100.0000 mg | ORAL_TABLET | Freq: Every day | ORAL | 1 refills | Status: DC

## 2020-02-21 MED ORDER — OMEPRAZOLE 20 MG PO CPDR: 20.0000 mg | DELAYED_RELEASE_CAPSULE | Freq: Every day | ORAL | 1 refills | Status: DC

## 2020-02-21 MED ORDER — TRAZODONE HCL 150 MG PO TABS: ORAL_TABLET | ORAL | 1 refills | Status: DC

## 2020-02-21 NOTE — Progress Notes (Signed)
Virtual Visit via Telephone Note  I connected with Thomas Baldwin on 02/21/20 at  3:30 PM EDT by telephone and verified that I am speaking with the correct person using two identifiers.   I discussed the limitations, risks, security and privacy concerns of performing an evaluation and management service by telephone and the availability of in person appointments. I also discussed with the patient that there may be a patient responsible charge related to this service. The patient expressed understanding and agreed to proceed.  Location patient: home Location provider: work office Participants present for the call: patient, provider Patient did not have a visit in the prior 7 days to address this/these issue(s).   History of Present Illness:  He has scheduled this visit mainly for medication refills as these were denied as he has not been seen in the office in over 18 months.  Also to update me on his care.  Unfortunately in January he had an episode of gross hematuria.  Following this CT scan showed findings concerning for left renal cell carcinoma and he had a left-sided nephrectomy in February of this year.  He has been doing well.  Is following with urology now every 6 months.  He states his blood pressures at home have been well controlled.  He needs refills of his blood pressure medication, his daily PPI that he takes for his GERD and his trazodone that he uses as needed for sleep.  He has no acute complaints.   Observations/Objective: Patient sounds cheerful and well on the phone. I do not appreciate any increased work of breathing. Speech and thought processing are grossly intact. Patient reported vitals: None reported   Current Outpatient Medications:  .  acetaminophen (TYLENOL) 325 MG tablet, Take 975 mg by mouth every 6 (six) hours as needed for moderate pain or headache., Disp: , Rfl:  .  amLODipine (NORVASC) 5 MG tablet, Take 1 tablet (5 mg total) by mouth daily., Disp: 90  tablet, Rfl: 0 .  aspirin 81 MG tablet, Take 81 mg by mouth daily., Disp: , Rfl:  .  losartan (COZAAR) 100 MG tablet, Take 1 tablet (100 mg total) by mouth daily., Disp: 90 tablet, Rfl: 1 .  metoprolol tartrate (LOPRESSOR) 50 MG tablet, Take 1 tablet by mouth twice daily, Disp: 180 tablet, Rfl: 0 .  Multiple Vitamins-Minerals (MULTIVITAMIN ADULT PO), Take by mouth daily., Disp: , Rfl:  .  omeprazole (PRILOSEC) 20 MG capsule, Take 1 capsule (20 mg total) by mouth daily., Disp: 90 capsule, Rfl: 1 .  traMADol (ULTRAM) 50 MG tablet, Take 1-2 tablets (50-100 mg total) by mouth every 6 (six) hours as needed for moderate pain., Disp: 15 tablet, Rfl: 0 .  traZODone (DESYREL) 150 MG tablet, TAKE 1 TABLET BY MOUTH AT BEDTIME . APPOINTMENT REQUIRED FOR FUTURE REFILLS, Disp: 90 tablet, Rfl: 1  Review of Systems:  Constitutional: Denies fever, chills, diaphoresis, appetite change and fatigue.  HEENT: Denies photophobia, eye pain, redness, hearing loss, ear pain, congestion, sore throat, rhinorrhea, sneezing, mouth sores, trouble swallowing, neck pain, neck stiffness and tinnitus.   Respiratory: Denies SOB, DOE, cough, chest tightness,  and wheezing.   Cardiovascular: Denies chest pain, palpitations and leg swelling.  Gastrointestinal: Denies nausea, vomiting, abdominal pain, diarrhea, constipation, blood in stool and abdominal distention.  Genitourinary: Denies dysuria, urgency, frequency, hematuria, flank pain and difficulty urinating.  Endocrine: Denies: hot or cold intolerance, sweats, changes in hair or nails, polyuria, polydipsia. Musculoskeletal: Denies myalgias, back pain, joint  swelling, arthralgias and gait problem.  Skin: Denies pallor, rash and wound.  Neurological: Denies dizziness, seizures, syncope, weakness, light-headedness, numbness and headaches.  Hematological: Denies adenopathy. Easy bruising, personal or family bleeding history  Psychiatric/Behavioral: Denies suicidal ideation, mood  changes, confusion, nervousness, sleep disturbance and agitation   Assessment and Plan:  Screening for malignant neoplasm of colon  - Plan: Ambulatory referral to Gastroenterology  Essential hypertension -Per patient report, well controlled.  - Plan: losartan (COZAAR) 100 MG tablet, omeprazole (PRILOSEC) 20 MG capsule, traZODone (DESYREL) 150 MG tablet  Paroxysmal atrial fibrillation (HCC) -He is rate controlled on metoprolol, not anticoagulated.  Impaired glucose tolerance -Is due for follow-up A1c  Kidney mass -Status post left nephrectomy, followed by urology  He has a physical scheduled for October.    I discussed the assessment and treatment plan with the patient. The patient was provided an opportunity to ask questions and all were answered. The patient agreed with the plan and demonstrated an understanding of the instructions.   The patient was advised to call back or seek an in-person evaluation if the symptoms worsen or if the condition fails to improve as anticipated.  I provided 23 minutes of non-face-to-face time during this encounter.   Lelon Frohlich, MD [facility]

## 2020-02-22 ENCOUNTER — Encounter (INDEPENDENT_AMBULATORY_CARE_PROVIDER_SITE_OTHER): Payer: Self-pay | Admitting: *Deleted

## 2020-02-23 ENCOUNTER — Encounter (INDEPENDENT_AMBULATORY_CARE_PROVIDER_SITE_OTHER): Payer: Self-pay | Admitting: *Deleted

## 2020-03-08 ENCOUNTER — Telehealth: Payer: Self-pay | Admitting: Internal Medicine

## 2020-03-08 NOTE — Telephone Encounter (Signed)
Noted  

## 2020-03-08 NOTE — Telephone Encounter (Signed)
traZODone (DESYREL) 150 MG tablet  [place], [place] - [address] [place] [address] Phone:  [phone]  Fax:  [phone]      It shows that this medication was sent to the pharmacy on 02/21/2020 but the pharmacy said that they don't have the Rx.   Please advise

## 2020-03-08 NOTE — Telephone Encounter (Signed)
The patient called [facility] and finally got someone on the phone and the live person was able to find his Rx.   Nothing further needed

## 2020-03-21 ENCOUNTER — Encounter (INDEPENDENT_AMBULATORY_CARE_PROVIDER_SITE_OTHER): Payer: 59 | Admitting: Ophthalmology

## 2020-03-27 ENCOUNTER — Encounter (INDEPENDENT_AMBULATORY_CARE_PROVIDER_SITE_OTHER): Payer: 59 | Admitting: Ophthalmology

## 2020-04-03 ENCOUNTER — Other Ambulatory Visit: Payer: Self-pay

## 2020-04-03 ENCOUNTER — Ambulatory Visit (INDEPENDENT_AMBULATORY_CARE_PROVIDER_SITE_OTHER): Payer: 59 | Admitting: Ophthalmology

## 2020-04-03 ENCOUNTER — Encounter (INDEPENDENT_AMBULATORY_CARE_PROVIDER_SITE_OTHER): Payer: Self-pay | Admitting: Ophthalmology

## 2020-04-03 DIAGNOSIS — H33301 Unspecified retinal break, right eye: Secondary | ICD-10-CM | POA: Diagnosis not present

## 2020-04-03 DIAGNOSIS — H35431 Paving stone degeneration of retina, right eye: Secondary | ICD-10-CM | POA: Diagnosis not present

## 2020-04-03 DIAGNOSIS — H43811 Vitreous degeneration, right eye: Secondary | ICD-10-CM

## 2020-04-03 DIAGNOSIS — Z961 Presence of intraocular lens: Secondary | ICD-10-CM

## 2020-04-03 DIAGNOSIS — H31009 Unspecified chorioretinal scars, unspecified eye: Secondary | ICD-10-CM | POA: Diagnosis not present

## 2020-04-03 DIAGNOSIS — H2512 Age-related nuclear cataract, left eye: Secondary | ICD-10-CM | POA: Insufficient documentation

## 2020-04-03 DIAGNOSIS — H35372 Puckering of macula, left eye: Secondary | ICD-10-CM

## 2020-04-03 NOTE — Progress Notes (Signed)
04/03/2020     CHIEF COMPLAINT Patient presents for Retina Follow Up   HISTORY OF PRESENT ILLNESS: Thomas Baldwin is a 64 y.o. male who presents to the clinic today for:   HPI    Retina Follow Up    Patient presents with  Other.  In both eyes.  This started 1 year ago.  Severity is mild.  Duration of 1 year.  Since onset it is stable.          Comments    1 Year F/U OU  Pt reports stable VA OU since last visit. Pt reports needing to wear readers to see up close. No new symptoms reported OU.       Last edited by Rockie Neighbours, [place] on 04/03/2020  9:25 AM. (History)      Referring physician: Isaac Bliss, Rayford Halsted, MD [place],  [place] [address]  HISTORICAL INFORMATION:   Selected notes from the MEDICAL RECORD NUMBER    Lab Results  Component Value Date   HGBA1C 5.4 09/01/2018     CURRENT MEDICATIONS: No current outpatient medications on file. (Ophthalmic Drugs)   No current facility-administered medications for this visit. (Ophthalmic Drugs)   Current Outpatient Medications (Other)  Medication Sig  . acetaminophen (TYLENOL) 325 MG tablet Take 975 mg by mouth every 6 (six) hours as needed for moderate pain or headache.  Marland Kitchen amLODipine (NORVASC) 5 MG tablet Take 1 tablet (5 mg total) by mouth daily.  Marland Kitchen aspirin 81 MG tablet Take 81 mg by mouth daily.  Marland Kitchen losartan (COZAAR) 100 MG tablet TAKE 1 TABLET BY MOUTH ONCE DAILY (MUST  KEEP  APPOINTMENT  FOR  FURTHER  REFILLS.)  . losartan (COZAAR) 100 MG tablet Take 1 tablet (100 mg total) by mouth daily.  . metoprolol tartrate (LOPRESSOR) 50 MG tablet Take 1 tablet by mouth twice daily  . Multiple Vitamins-Minerals (MULTIVITAMIN ADULT PO) Take by mouth daily.  Marland Kitchen omeprazole (PRILOSEC) 20 MG capsule Take 1 capsule (20 mg total) by mouth daily.  . traMADol (ULTRAM) 50 MG tablet Take 1-2 tablets (50-100 mg total) by mouth every 6 (six) hours as needed for moderate pain.  . traZODone (DESYREL) 150 MG tablet  TAKE 1 TABLET BY MOUTH AT BEDTIME . APPOINTMENT REQUIRED FOR FUTURE REFILLS   No current facility-administered medications for this visit. (Other)      REVIEW OF SYSTEMS:    ALLERGIES No Known Allergies  PAST MEDICAL HISTORY Past Medical History:  Diagnosis Date  . Atrial fibrillation ([place])   . Atypical chest pain   . GERD (gastroesophageal reflux disease)    Questionable  . Hematuria none since 07-22-2019  . Hypertension   . Nerve damage    left hand   Past Surgical History:  Procedure Laterality Date  . colonscopy  2010  . CYSTOSCOPY WITH RETROGRADE PYELOGRAM, URETEROSCOPY AND STENT PLACEMENT Bilateral 08/04/2019   Procedure: CYSTOSCOPY WITH BILATERAL  RETROGRADE PYELOGRAM, LEFT DIAGNOSTIC URETEROSCOPY  AND POSSIBLE BIOPSY AND STENT PLACEMENT;  Surgeon: Ardis Hughs, MD;  Location: [facility];  Service: Urology;  Laterality: Bilateral;  . LAPAROSCOPIC NEPHRECTOMY, HAND ASSISTED Left 08/24/2019   Procedure: LAPAROSCOPIC LEFT NEPHRECTOMY;  Surgeon: Ardis Hughs, MD;  Location: [facility];  Service: Urology;  Laterality: Left;  . NOSE SURGERY  1980  . TONSILECTOMY, ADENOIDECTOMY, BILATERAL MYRINGOTOMY AND TUBES      FAMILY HISTORY Family History  Problem Relation Age of Onset  . Coronary artery disease Other   .  Diabetes Other   . Hypertension Other   . Colon cancer Neg Hx     SOCIAL HISTORY Social History   Tobacco Use  . Smoking status: Current Some Day Smoker    Types: Cigars  . Smokeless tobacco: Never Used  . Tobacco comment: occ  Vaping Use  . Vaping Use: Never used  Substance Use Topics  . Alcohol use: Yes    Alcohol/week: 21.0 standard drinks    Types: 21 Cans of beer per week    Comment: Socially, 3 beers a day as of 05/23/2019  . Drug use: Not Currently         OPHTHALMIC EXAM:  Base Eye Exam    Visual Acuity (ETDRS)      Right Left   Dist [place] 20/20 20/30 -2   Dist ph Coffee  NI       Tonometry (Tonopen, 9:26 AM)        Right Left   Pressure 16 12       Pupils      Shape React APD   Right Round Brisk None   Left Round Brisk None       Visual Fields (Counting fingers)      Left Right    Full Full       Extraocular Movement      Right Left    Full Full       Neuro/Psych    Oriented x3: Yes   Mood/Affect: Normal       Dilation    Both eyes: 1.0% Mydriacyl, 2.5% Phenylephrine @ 9:28 AM        Slit Lamp and Fundus Exam    External Exam      Right Left   External Normal Normal       Slit Lamp Exam      Right Left   Lids/Lashes Normal Normal   Conjunctiva/Sclera White and quiet White and quiet   Cornea Clear Clear   Anterior Chamber Deep and quiet Deep and quiet   Iris Round and reactive Round and reactive   Lens Posterior chamber intraocular lens Posterior chamber intraocular lens   Anterior Vitreous Normal Normal       Fundus Exam      Right Left   Posterior Vitreous Posterior vitreous detachment Normal   Disc Normal Normal   C/D Ratio 0.35 0.35   Macula Normal Normal   Vessels Normal Normal   Periphery Normal,, no new breaks, good retinopexy nasally and inferiorly OD Normal          IMAGING AND PROCEDURES  Imaging and Procedures for 04/03/20  OCT, Retina - OU - Both Eyes       Right Eye Quality was good. Scan locations included subfoveal. Central Foveal Thickness: 274.   Left Eye Quality was good. Scan locations included subfoveal. Central Foveal Thickness: 275. Findings include epiretinal membrane.   Notes OD with posterior vitreous detachment, no active maculopathy  OS with nondistorting epiretinal membrane roll to the fovea, observe                ASSESSMENT/PLAN:  Macular pucker, left eye This condition minor will observe      ICD-10-CM   1. Posterior vitreous detachment of right eye  H43.811 OCT, Retina - OU - Both Eyes  2. Retinal break of right eye  H33.301   3. 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  Chorioretinal scar, unspecified laterality  H31.009  5. Pseudophakia of right eye  Z96.1   6. Pseudophakia, left eye  Z96.1   7. Macular pucker, left eye  H35.372     1.  No Active maculopathy OU  2.  Minor epiretinal membrane OS, observe  3.  OD, no new breaks today OU  Ophthalmic Meds Ordered this visit:  No orders of the defined types were placed in this encounter.      Return in about 2 years (around 04/03/2022) for Follow up with [facility] annually.  There are no Patient Instructions on file for this visit.   Explained the diagnoses, plan, and follow up with the patient and they expressed understanding.  Patient expressed understanding of the importance of proper follow up care.   Clent Demark Charmain Diosdado M.D. Diseases & Surgery of the Retina and Vitreous [facility] [place] 04/03/20     Abbreviations: M myopia (nearsighted); A astigmatism; H hyperopia (farsighted); P presbyopia; Mrx spectacle prescription;  CTL contact lenses; OD right eye; OS left eye; OU both eyes  XT exotropia; ET esotropia; PEK punctate epithelial keratitis; PEE punctate epithelial erosions; DES dry eye syndrome; MGD meibomian gland dysfunction; ATs artificial tears; PFAT's preservative free artificial tears; Nina nuclear sclerotic cataract; PSC posterior subcapsular cataract; ERM epi-retinal membrane; PVD posterior vitreous detachment; RD retinal detachment; DM diabetes mellitus; DR diabetic retinopathy; NPDR non-proliferative diabetic retinopathy; PDR proliferative diabetic retinopathy; CSME clinically significant macular edema; DME diabetic macular edema; dbh dot blot hemorrhages; CWS cotton wool spot; POAG primary open angle glaucoma; C/D cup-to-disc ratio; HVF humphrey visual field; GVF goldmann visual field; OCT optical coherence tomography; IOP intraocular pressure; BRVO Branch retinal vein occlusion; CRVO central retinal vein occlusion; CRAO central retinal artery occlusion; BRAO branch retinal  artery occlusion; RT retinal tear; SB scleral buckle; PPV pars plana vitrectomy; VH Vitreous hemorrhage; PRP panretinal laser photocoagulation; IVK intravitreal kenalog; VMT vitreomacular traction; MH Macular hole;  NVD neovascularization of the disc; NVE neovascularization elsewhere; AREDS age related eye disease study; ARMD age related macular degeneration; POAG primary open angle glaucoma; EBMD epithelial/anterior basement membrane dystrophy; ACIOL anterior chamber intraocular lens; IOL intraocular lens; PCIOL posterior chamber intraocular lens; Phaco/IOL phacoemulsification with intraocular lens placement; Becker photorefractive keratectomy; LASIK laser assisted in situ keratomileusis; HTN hypertension; DM diabetes mellitus; COPD chronic obstructive pulmonary disease

## 2020-04-03 NOTE — Assessment & Plan Note (Signed)
This condition minor will observe

## 2020-04-17 ENCOUNTER — Encounter: Payer: Self-pay | Admitting: Internal Medicine

## 2020-04-17 ENCOUNTER — Ambulatory Visit (INDEPENDENT_AMBULATORY_CARE_PROVIDER_SITE_OTHER): Payer: 59 | Admitting: Internal Medicine

## 2020-04-17 ENCOUNTER — Other Ambulatory Visit: Payer: Self-pay

## 2020-04-17 VITALS — BP 138/82 | HR 66 | Temp 97.6°F | Ht 77.75 in | Wt 233.8 lb

## 2020-04-17 DIAGNOSIS — Z23 Encounter for immunization: Secondary | ICD-10-CM | POA: Diagnosis not present

## 2020-04-17 DIAGNOSIS — Z0000 Encounter for general adult medical examination without abnormal findings: Secondary | ICD-10-CM | POA: Diagnosis not present

## 2020-04-17 DIAGNOSIS — R7302 Impaired glucose tolerance (oral): Secondary | ICD-10-CM

## 2020-04-17 DIAGNOSIS — I10 Essential (primary) hypertension: Secondary | ICD-10-CM

## 2020-04-17 DIAGNOSIS — N2889 Other specified disorders of kidney and ureter: Secondary | ICD-10-CM | POA: Diagnosis not present

## 2020-04-17 DIAGNOSIS — I480 Paroxysmal atrial fibrillation: Secondary | ICD-10-CM

## 2020-04-17 DIAGNOSIS — Z1211 Encounter for screening for malignant neoplasm of colon: Secondary | ICD-10-CM

## 2020-04-17 DIAGNOSIS — E559 Vitamin D deficiency, unspecified: Secondary | ICD-10-CM

## 2020-04-17 NOTE — Addendum Note (Signed)
Addended by: Westley Hummer B on: 04/17/2020 10:54 AM   Modules accepted: Orders

## 2020-04-17 NOTE — Progress Notes (Signed)
Established Patient Office Visit     This visit occurred during the SARS-CoV-2 public health emergency.  Safety protocols were in place, including screening questions prior to the visit, additional usage of staff PPE, and extensive cleaning of exam room while observing appropriate contact time as indicated for disinfecting solutions.    CC/Reason for Visit: Annual preventive exam  HPI: Thomas Baldwin is a 64 y.o. male who is coming in today for the above mentioned reasons. Past Medical History is significant for: Hypertension, GERD,paroxysmal atrial fibrillation. No acute complaints.  Unfortunately he was diagnosed with left renal cell carcinoma earlier this year and underwent a left nephrectomy.  His wife is still battling stage IV lung cancer.  He has routine eye care but no dental care.  He is requesting his flu vaccine today.  He is overdue for screening colonoscopy.  He is fully vaccinated against Covid including his booster shot.   Past Medical/Surgical History: Past Medical History:  Diagnosis Date  . Atrial fibrillation ([place])   . Atypical chest pain   . GERD (gastroesophageal reflux disease)    Questionable  . Hematuria none since 07-22-2019  . Hypertension   . Nerve damage    left hand    Past Surgical History:  Procedure Laterality Date  . colonscopy  2010  . CYSTOSCOPY WITH RETROGRADE PYELOGRAM, URETEROSCOPY AND STENT PLACEMENT Bilateral 08/04/2019   Procedure: CYSTOSCOPY WITH BILATERAL  RETROGRADE PYELOGRAM, LEFT DIAGNOSTIC URETEROSCOPY  AND POSSIBLE BIOPSY AND STENT PLACEMENT;  Surgeon: Ardis Hughs, MD;  Location: [facility];  Service: Urology;  Laterality: Bilateral;  . LAPAROSCOPIC NEPHRECTOMY, HAND ASSISTED Left 08/24/2019   Procedure: LAPAROSCOPIC LEFT NEPHRECTOMY;  Surgeon: Ardis Hughs, MD;  Location: [facility];  Service: Urology;  Laterality: Left;  . NOSE SURGERY  1980  . TONSILECTOMY, ADENOIDECTOMY, BILATERAL MYRINGOTOMY AND  TUBES      Social History:  reports that he has been smoking cigars. He has never used smokeless tobacco. He reports current alcohol use of about 21.0 standard drinks of alcohol per week. He reports previous drug use.  Allergies: No Known Allergies  Family History:  Family History  Problem Relation Age of Onset  . Coronary artery disease Other   . Diabetes Other   . Hypertension Other   . Colon cancer Neg Hx      Current Outpatient Medications:  .  acetaminophen (TYLENOL) 325 MG tablet, Take 975 mg by mouth every 6 (six) hours as needed for moderate pain or headache., Disp: , Rfl:  .  amLODipine (NORVASC) 5 MG tablet, Take 1 tablet (5 mg total) by mouth daily., Disp: 90 tablet, Rfl: 0 .  aspirin 81 MG tablet, Take 81 mg by mouth daily., Disp: , Rfl:  .  losartan (COZAAR) 100 MG tablet, Take 1 tablet (100 mg total) by mouth daily., Disp: 90 tablet, Rfl: 1 .  metoprolol tartrate (LOPRESSOR) 50 MG tablet, Take 1 tablet by mouth twice daily, Disp: 180 tablet, Rfl: 0 .  Multiple Vitamins-Minerals (MULTIVITAMIN ADULT PO), Take by mouth daily., Disp: , Rfl:  .  omeprazole (PRILOSEC) 20 MG capsule, Take 1 capsule (20 mg total) by mouth daily., Disp: 90 capsule, Rfl: 1 .  traZODone (DESYREL) 150 MG tablet, TAKE 1 TABLET BY MOUTH AT BEDTIME . APPOINTMENT REQUIRED FOR FUTURE REFILLS, Disp: 90 tablet, Rfl: 1  Review of Systems:  Constitutional: Denies fever, chills, diaphoresis, appetite change and fatigue.  HEENT: Denies photophobia, eye pain, redness, hearing loss, ear  pain, congestion, sore throat, rhinorrhea, sneezing, mouth sores, trouble swallowing, neck pain, neck stiffness and tinnitus.   Respiratory: Denies SOB, DOE, cough, chest tightness,  and wheezing.   Cardiovascular: Denies chest pain, palpitations and leg swelling.  Gastrointestinal: Denies nausea, vomiting, abdominal pain, diarrhea, constipation, blood in stool and abdominal distention.  Genitourinary: Denies dysuria, urgency,  frequency, hematuria, flank pain and difficulty urinating.  Endocrine: Denies: hot or cold intolerance, sweats, changes in hair or nails, polyuria, polydipsia. Musculoskeletal: Denies myalgias, back pain, joint swelling, arthralgias and gait problem.  Skin: Denies pallor, rash and wound.  Neurological: Denies dizziness, seizures, syncope, weakness, light-headedness, numbness and headaches.  Hematological: Denies adenopathy. Easy bruising, personal or family bleeding history  Psychiatric/Behavioral: Denies suicidal ideation, mood changes, confusion, nervousness, sleep disturbance and agitation    Physical Exam: Vitals:   04/17/20 1003  BP: 138/82  Pulse: 66  Temp: 97.6 F (36.4 C)  TempSrc: Oral  SpO2: 97%  Weight: 233 lb 12.8 oz (106.1 kg)  Height: 6' 5.75" (1.975 m)    Body mass index is 27.19 kg/m.   Constitutional: NAD, calm, comfortable Eyes: PERRL, lids and conjunctivae normal ENMT: Mucous membranes are moist. Posterior pharynx clear of any exudate or lesions. Normal dentition. Tympanic membrane is pearly white, no erythema or bulging. Neck: normal, supple, no masses, no thyromegaly Respiratory: clear to auscultation bilaterally, no wheezing, no crackles. Normal respiratory effort. No accessory muscle use.  Cardiovascular: Regular rate and rhythm, no murmurs / rubs / gallops. No extremity edema. 2+ pedal pulses. No carotid bruits.  Abdomen: no tenderness, no masses palpated. No hepatosplenomegaly. Bowel sounds positive.  Musculoskeletal: no clubbing / cyanosis. No joint deformity upper and lower extremities. Good ROM, no contractures. Normal muscle tone.  Skin: no rashes, lesions, ulcers. No induration Neurologic: CN 2-12 grossly intact. Sensation intact, DTR normal. Strength 5/5 in all 4.  Psychiatric: Normal judgment and insight. Alert and oriented x 3. Normal mood.    Impression and Plan:  Encounter for preventive health examination  -Have advised routine eye and  dental care. -Flu vaccine today, otherwise immunizations are up-to-date. -Screening labs today. -Healthy lifestyle discussed in detail. -Refer to GI for colon cancer screening. -PSA today for prostate cancer screening.  Need for influenza vaccination -Flu vaccine administered today.  Kidney mass -Left renal cell carcinoma, status post nephrectomy in early 2021.  Impaired glucose tolerance  - Plan: Hemoglobin A1c -Last A1c was 5.4 in February 2020.  Essential hypertension  -Well-controlled on losartan 100 mg and metoprolol 50 mg daily.  Paroxysmal atrial fibrillation ([facility])  -Rate controlled on metoprolol, he refuses anticoagulation.   Patient Instructions  -Nice seeing you today!!  -Lab work today; will notify you once results are available.  -Flu vaccine today.  -Schedule follow up in 6 months or sooner as needed.   Preventive Care 1-55 Years Old, Male Preventive care refers to lifestyle choices and visits with your health care provider that can promote health and wellness. This includes:  A yearly physical exam. This is also called an annual well check.  Regular dental and eye exams.  Immunizations.  Screening for certain conditions.  Healthy lifestyle choices, such as eating a healthy diet, getting regular exercise, not using drugs or products that contain nicotine and tobacco, and limiting alcohol use. What can I expect for my preventive care visit? Physical exam Your health care provider will check:  Height and weight. These may be used to calculate body mass index (BMI), which is a measurement that tells  if you are at a healthy weight.  Heart rate and blood pressure.  Your skin for abnormal spots. Counseling Your health care provider may ask you questions about:  Alcohol, tobacco, and drug use.  Emotional well-being.  Home and relationship well-being.  Sexual activity.  Eating habits.  Work and work Statistician. What immunizations do I  need?  Influenza (flu) vaccine  This is recommended every year. Tetanus, diphtheria, and pertussis (Tdap) vaccine  You may need a Td booster every 10 years. Varicella (chickenpox) vaccine  You may need this vaccine if you have not already been vaccinated. Zoster (shingles) vaccine  You may need this after age 88. Measles, mumps, and rubella (MMR) vaccine  You may need at least one dose of MMR if you were born in 1957 or later. You may also need a second dose. Pneumococcal conjugate (PCV13) vaccine  You may need this if you have certain conditions and were not previously vaccinated. Pneumococcal polysaccharide (PPSV23) vaccine  You may need one or two doses if you smoke cigarettes or if you have certain conditions. Meningococcal conjugate (MenACWY) vaccine  You may need this if you have certain conditions. Hepatitis A vaccine  You may need this if you have certain conditions or if you travel or work in places where you may be exposed to hepatitis A. Hepatitis B vaccine  You may need this if you have certain conditions or if you travel or work in places where you may be exposed to hepatitis B. Haemophilus influenzae type b (Hib) vaccine  You may need this if you have certain risk factors. Human papillomavirus (HPV) vaccine  If recommended by your health care provider, you may need three doses over 6 months. You may receive vaccines as individual doses or as more than one vaccine together in one shot (combination vaccines). Talk with your health care provider about the risks and benefits of combination vaccines. What tests do I need? Blood tests  Lipid and cholesterol levels. These may be checked every 5 years, or more frequently if you are over 19 years old.  Hepatitis C test.  Hepatitis B test. Screening  Lung cancer screening. You may have this screening every year starting at age 33 if you have a 30-pack-year history of smoking and currently smoke or have quit within  the past 15 years.  Prostate cancer screening. Recommendations will vary depending on your family history and other risks.  Colorectal cancer screening. All adults should have this screening starting at age 42 and continuing until age 55. Your health care provider may recommend screening at age 22 if you are at increased risk. You will have tests every 1-10 years, depending on your results and the type of screening test.  Diabetes screening. This is done by checking your blood sugar (glucose) after you have not eaten for a while (fasting). You may have this done every 1-3 years.  Sexually transmitted disease (STD) testing. Follow these instructions at home: Eating and drinking  Eat a diet that includes fresh fruits and vegetables, whole grains, lean protein, and low-fat dairy products.  Take vitamin and mineral supplements as recommended by your health care provider.  Do not drink alcohol if your health care provider tells you not to drink.  If you drink alcohol: ? Limit how much you have to 0-2 drinks a day. ? Be aware of how much alcohol is in your drink. In the [place] one drink equals one 12 oz bottle of beer (355 mL), one 5 oz  glass of wine (148 mL), or one 1 oz glass of hard liquor (44 mL). Lifestyle  Take daily care of your teeth and gums.  Stay active. Exercise for at least 30 minutes on 5 or more days each week.  Do not use any products that contain nicotine or tobacco, such as cigarettes, e-cigarettes, and chewing tobacco. If you need help quitting, ask your health care provider.  If you are sexually active, practice safe sex. Use a condom or other form of protection to prevent STIs (sexually transmitted infections).  Talk with your health care provider about taking a low-dose aspirin every day starting at age 29. What's next?  Go to your health care provider once a year for a well check visit.  Ask your health care provider how often you should have your eyes and teeth  checked.  Stay up to date on all vaccines. This information is not intended to replace advice given to you by your health care provider. Make sure you discuss any questions you have with your health care provider. Document Revised: 06/23/2018 Document Reviewed: 06/23/2018 Elsevier Patient Education  2020 [place], [place] [facility]

## 2020-04-17 NOTE — Patient Instructions (Signed)
-Nice seeing you today!!  -Lab work today; will notify you once results are available.  -Flu vaccine today.  -Schedule follow up in 6 months or sooner as needed.   Preventive Care 44-65 Years Old, Male Preventive care refers to lifestyle choices and visits with your health care provider that can promote health and wellness. This includes:  A yearly physical exam. This is also called an annual well check.  Regular dental and eye exams.  Immunizations.  Screening for certain conditions.  Healthy lifestyle choices, such as eating a healthy diet, getting regular exercise, not using drugs or products that contain nicotine and tobacco, and limiting alcohol use. What can I expect for my preventive care visit? Physical exam Your health care provider will check:  Height and weight. These may be used to calculate body mass index (BMI), which is a measurement that tells if you are at a healthy weight.  Heart rate and blood pressure.  Your skin for abnormal spots. Counseling Your health care provider may ask you questions about:  Alcohol, tobacco, and drug use.  Emotional well-being.  Home and relationship well-being.  Sexual activity.  Eating habits.  Work and work Statistician. What immunizations do I need?  Influenza (flu) vaccine  This is recommended every year. Tetanus, diphtheria, and pertussis (Tdap) vaccine  You may need a Td booster every 10 years. Varicella (chickenpox) vaccine  You may need this vaccine if you have not already been vaccinated. Zoster (shingles) vaccine  You may need this after age 2. Measles, mumps, and rubella (MMR) vaccine  You may need at least one dose of MMR if you were born in 1957 or later. You may also need a second dose. Pneumococcal conjugate (PCV13) vaccine  You may need this if you have certain conditions and were not previously vaccinated. Pneumococcal polysaccharide (PPSV23) vaccine  You may need one or two doses if you  smoke cigarettes or if you have certain conditions. Meningococcal conjugate (MenACWY) vaccine  You may need this if you have certain conditions. Hepatitis A vaccine  You may need this if you have certain conditions or if you travel or work in places where you may be exposed to hepatitis A. Hepatitis B vaccine  You may need this if you have certain conditions or if you travel or work in places where you may be exposed to hepatitis B. Haemophilus influenzae type b (Hib) vaccine  You may need this if you have certain risk factors. Human papillomavirus (HPV) vaccine  If recommended by your health care provider, you may need three doses over 6 months. You may receive vaccines as individual doses or as more than one vaccine together in one shot (combination vaccines). Talk with your health care provider about the risks and benefits of combination vaccines. What tests do I need? Blood tests  Lipid and cholesterol levels. These may be checked every 5 years, or more frequently if you are over 18 years old.  Hepatitis C test.  Hepatitis B test. Screening  Lung cancer screening. You may have this screening every year starting at age 50 if you have a 30-pack-year history of smoking and currently smoke or have quit within the past 15 years.  Prostate cancer screening. Recommendations will vary depending on your family history and other risks.  Colorectal cancer screening. All adults should have this screening starting at age 54 and continuing until age 24. Your health care provider may recommend screening at age 54 if you are at increased risk. You will  have tests every 1-10 years, depending on your results and the type of screening test.  Diabetes screening. This is done by checking your blood sugar (glucose) after you have not eaten for a while (fasting). You may have this done every 1-3 years.  Sexually transmitted disease (STD) testing. Follow these instructions at home: Eating and  drinking  Eat a diet that includes fresh fruits and vegetables, whole grains, lean protein, and low-fat dairy products.  Take vitamin and mineral supplements as recommended by your health care provider.  Do not drink alcohol if your health care provider tells you not to drink.  If you drink alcohol: ? Limit how much you have to 0-2 drinks a day. ? Be aware of how much alcohol is in your drink. In the [place] one drink equals one 12 oz bottle of beer (355 mL), one 5 oz glass of wine (148 mL), or one 1 oz glass of hard liquor (44 mL). Lifestyle  Take daily care of your teeth and gums.  Stay active. Exercise for at least 30 minutes on 5 or more days each week.  Do not use any products that contain nicotine or tobacco, such as cigarettes, e-cigarettes, and chewing tobacco. If you need help quitting, ask your health care provider.  If you are sexually active, practice safe sex. Use a condom or other form of protection to prevent STIs (sexually transmitted infections).  Talk with your health care provider about taking a low-dose aspirin every day starting at age 26. What's next?  Go to your health care provider once a year for a well check visit.  Ask your health care provider how often you should have your eyes and teeth checked.  Stay up to date on all vaccines. This information is not intended to replace advice given to you by your health care provider. Make sure you discuss any questions you have with your health care provider. Document Revised: 06/23/2018 Document Reviewed: 06/23/2018 Elsevier Patient Education  2020 [employer].

## 2020-04-17 NOTE — Addendum Note (Signed)
Addended by: Westley Hummer B on: 04/17/2020 05:33 PM   Modules accepted: Orders

## 2020-04-18 ENCOUNTER — Encounter: Payer: Self-pay | Admitting: Internal Medicine

## 2020-04-18 LAB — CBC WITH DIFFERENTIAL/PLATELET
Absolute Monocytes: 653 cells/uL (ref 200–950)
Basophils Absolute: 43 cells/uL (ref 0–200)
Basophils Relative: 0.7 %
Eosinophils Absolute: 348 cells/uL (ref 15–500)
Eosinophils Relative: 5.7 %
HCT: 39.1 % (ref 38.5–50.0)
Hemoglobin: 13.6 g/dL (ref 13.2–17.1)
Lymphs Abs: 799 cells/uL — ABNORMAL LOW (ref 850–3900)
MCH: 33.6 pg — ABNORMAL HIGH (ref 27.0–33.0)
MCHC: 34.8 g/dL (ref 32.0–36.0)
MCV: 96.5 fL (ref 80.0–100.0)
MPV: 10.7 fL (ref 7.5–12.5)
Monocytes Relative: 10.7 %
Neutro Abs: 4258 cells/uL (ref 1500–7800)
Neutrophils Relative %: 69.8 %
Platelets: 194 10*3/uL (ref 140–400)
RBC: 4.05 10*6/uL — ABNORMAL LOW (ref 4.20–5.80)
RDW: 12.1 % (ref 11.0–15.0)
Total Lymphocyte: 13.1 %
WBC: 6.1 10*3/uL (ref 3.8–10.8)

## 2020-04-18 LAB — COMPREHENSIVE METABOLIC PANEL WITH GFR
AG Ratio: 1.5 (calc) (ref 1.0–2.5)
ALT: 18 U/L (ref 9–46)
AST: 19 U/L (ref 10–35)
Albumin: 4.3 g/dL (ref 3.6–5.1)
Alkaline phosphatase (APISO): 69 U/L (ref 35–144)
BUN: 15 mg/dL (ref 7–25)
CO2: 29 mmol/L (ref 20–32)
Calcium: 9.6 mg/dL (ref 8.6–10.3)
Chloride: 102 mmol/L (ref 98–110)
Creat: 1.21 mg/dL (ref 0.70–1.25)
Globulin: 2.8 g/dL (ref 1.9–3.7)
Glucose, Bld: 109 mg/dL — ABNORMAL HIGH (ref 65–99)
Potassium: 4.7 mmol/L (ref 3.5–5.3)
Sodium: 140 mmol/L (ref 135–146)
Total Bilirubin: 0.6 mg/dL (ref 0.2–1.2)
Total Protein: 7.1 g/dL (ref 6.1–8.1)

## 2020-04-18 LAB — VITAMIN B12: Vitamin B-12: 515 pg/mL (ref 200–1100)

## 2020-04-18 LAB — LIPID PANEL
Cholesterol: 138 mg/dL
HDL: 37 mg/dL — ABNORMAL LOW
LDL Cholesterol (Calc): 79 mg/dL
Non-HDL Cholesterol (Calc): 101 mg/dL
Total CHOL/HDL Ratio: 3.7 (calc)
Triglycerides: 121 mg/dL

## 2020-04-18 LAB — TSH: TSH: 1.24 m[IU]/L (ref 0.40–4.50)

## 2020-04-18 LAB — HEMOGLOBIN A1C
Hgb A1c MFr Bld: 5.3 %{Hb}
Mean Plasma Glucose: 105 (calc)
eAG (mmol/L): 5.8 (calc)

## 2020-04-18 LAB — VITAMIN D 25 HYDROXY (VIT D DEFICIENCY, FRACTURES): Vit D, 25-Hydroxy: 31 ng/mL (ref 30–100)

## 2020-04-21 ENCOUNTER — Encounter: Payer: Self-pay | Admitting: Internal Medicine

## 2020-04-21 ENCOUNTER — Other Ambulatory Visit: Payer: Self-pay | Admitting: Internal Medicine

## 2020-04-21 DIAGNOSIS — E559 Vitamin D deficiency, unspecified: Secondary | ICD-10-CM

## 2020-04-21 MED ORDER — VITAMIN D (ERGOCALCIFEROL) 1.25 MG (50000 UNIT) PO CAPS: 50000.0000 [IU] | ORAL_CAPSULE | ORAL | 0 refills | Status: AC

## 2020-04-22 ENCOUNTER — Encounter: Payer: Self-pay | Admitting: Internal Medicine

## 2020-04-22 ENCOUNTER — Other Ambulatory Visit: Payer: Self-pay | Admitting: Internal Medicine

## 2020-04-22 DIAGNOSIS — I10 Essential (primary) hypertension: Secondary | ICD-10-CM

## 2020-04-22 NOTE — Addendum Note (Signed)
Addended by: Lahoma Crocker A on: 04/22/2020 11:40 AM   Modules accepted: Orders

## 2020-06-10 ENCOUNTER — Other Ambulatory Visit: Payer: Self-pay

## 2020-06-10 ENCOUNTER — Encounter: Payer: Self-pay | Admitting: Gastroenterology

## 2020-06-10 ENCOUNTER — Ambulatory Visit (INDEPENDENT_AMBULATORY_CARE_PROVIDER_SITE_OTHER): Payer: 59 | Admitting: Gastroenterology

## 2020-06-10 VITALS — BP 126/79 | HR 65 | Temp 96.9°F | Ht 78.0 in | Wt 232.2 lb

## 2020-06-10 DIAGNOSIS — Z1211 Encounter for screening for malignant neoplasm of colon: Secondary | ICD-10-CM | POA: Diagnosis not present

## 2020-06-10 DIAGNOSIS — Z85528 Personal history of other malignant neoplasm of kidney: Secondary | ICD-10-CM | POA: Diagnosis not present

## 2020-06-10 DIAGNOSIS — Z0000 Encounter for general adult medical examination without abnormal findings: Secondary | ICD-10-CM

## 2020-06-10 NOTE — Patient Instructions (Signed)
Colonoscopy as scheduled.  Please see separate instructions.

## 2020-06-10 NOTE — Progress Notes (Signed)
Primary Care Physician:  Isaac Bliss, Rayford Halsted, MD  Primary Gastroenterologist:  Garfield Cornea, MD   Chief Complaint  Patient presents with  . Colonoscopy    last tcs 2008; had kidney removed d/t cancer this year    HPI:  Thomas Baldwin is a 64 y.o. male here to reschedule colonoscopy that he had postponed last year due to kidney cancer.  Colonoscopy in July 2008, no polyps.  From a GI standpoint he is doing well.  Denies any abdominal pain, bowel concerns, melena, rectal bleeding, or upper GI symptoms.  No family history of colon cancer.    Current Outpatient Medications  Medication Sig Dispense Refill  . acetaminophen (TYLENOL) 325 MG tablet Take 975 mg by mouth every 6 (six) hours as needed for moderate pain or headache.    Marland Kitchen amLODipine (NORVASC) 5 MG tablet Take 1 tablet by mouth once daily 90 tablet 1  . losartan (COZAAR) 100 MG tablet Take 1 tablet (100 mg total) by mouth daily. 90 tablet 1  . metoprolol tartrate (LOPRESSOR) 50 MG tablet TAKE 1 TABLET BY MOUTH TWICE DAILY - PLEASE SCHEDULE AN OFFICE VISIT FOR MORE REFILLS 180 tablet 1  . Multiple Vitamins-Minerals (MULTIVITAMIN ADULT PO) Take by mouth daily.    Marland Kitchen omeprazole (PRILOSEC) 20 MG capsule Take 1 capsule (20 mg total) by mouth daily. 90 capsule 1  . traZODone (DESYREL) 150 MG tablet TAKE 1 TABLET BY MOUTH AT BEDTIME . APPOINTMENT REQUIRED FOR FUTURE REFILLS 90 tablet 1  . Vitamin D, Ergocalciferol, (DRISDOL) 1.25 MG (50000 UNIT) CAPS capsule Take 1 capsule (50,000 Units total) by mouth every 7 (seven) days for 12 doses. 12 capsule 0   No current facility-administered medications for this visit.    Allergies as of 06/10/2020  . (No Known Allergies)    Past Medical History:  Diagnosis Date  . Atrial fibrillation ([place])   . Atypical chest pain   . GERD (gastroesophageal reflux disease)    Questionable  . Hematuria none since 07-22-2019  . Hypertension   . Nerve damage    left hand    Past Surgical History:   Procedure Laterality Date  . colonscopy  2010  . CYSTOSCOPY WITH RETROGRADE PYELOGRAM, URETEROSCOPY AND STENT PLACEMENT Bilateral 08/04/2019   Procedure: CYSTOSCOPY WITH BILATERAL  RETROGRADE PYELOGRAM, LEFT DIAGNOSTIC URETEROSCOPY  AND POSSIBLE BIOPSY AND STENT PLACEMENT;  Surgeon: Ardis Hughs, MD;  Location: [facility];  Service: Urology;  Laterality: Bilateral;  . LAPAROSCOPIC NEPHRECTOMY, HAND ASSISTED Left 08/24/2019   Procedure: LAPAROSCOPIC LEFT NEPHRECTOMY;  Surgeon: Ardis Hughs, MD;  Location: [facility];  Service: Urology;  Laterality: Left;  . NOSE SURGERY  1980  . TONSILECTOMY, ADENOIDECTOMY, BILATERAL MYRINGOTOMY AND TUBES      Family History  Problem Relation Age of Onset  . Coronary artery disease Other   . Diabetes Other   . Hypertension Other   . Colon cancer Neg Hx     Social History   Socioeconomic History  . Marital status: Married    Spouse name: Not on file  . Number of children: Not on file  . Years of education: Not on file  . Highest education level: Not on file  Occupational History  . Not on file  Tobacco Use  . Smoking status: Current Some Day Smoker    Types: Cigars  . Smokeless tobacco: Never Used  . Tobacco comment: occ  Vaping Use  . Vaping Use: Never used  Substance and Sexual Activity  .  Alcohol use: Yes    Alcohol/week: 21.0 standard drinks    Types: 21 Cans of beer per week    Comment: approx 3 beers/day  . Drug use: Not Currently  . Sexual activity: Not on file  Other Topics Concern  . Not on file  Social History Narrative  . Not on file   Social Determinants of Health   Financial Resource Strain:   . Difficulty of Paying Living Expenses: Not on file  Food Insecurity:   . Worried About Charity fundraiser in the Last Year: Not on file  . Ran Out of Food in the Last Year: Not on file  Transportation Needs:   . Lack of Transportation (Medical): Not on file  . Lack of Transportation (Non-Medical):  Not on file  Physical Activity:   . Days of Exercise per Week: Not on file  . Minutes of Exercise per Session: Not on file  Stress:   . Feeling of Stress : Not on file  Social Connections:   . Frequency of Communication with Friends and Family: Not on file  . Frequency of Social Gatherings with Friends and Family: Not on file  . Attends Religious Services: Not on file  . Active Member of Clubs or Organizations: Not on file  . Attends Archivist Meetings: Not on file  . Marital Status: Not on file  Intimate Partner Violence:   . Fear of Current or Ex-Partner: Not on file  . Emotionally Abused: Not on file  . Physically Abused: Not on file  . Sexually Abused: Not on file      ROS:  General: Negative for anorexia, weight loss, fever, chills, fatigue, weakness. Eyes: Negative for vision changes.  ENT: Negative for hoarseness, difficulty swallowing , nasal congestion. CV: Negative for chest pain, angina, palpitations, dyspnea on exertion, peripheral edema.  Respiratory: Negative for dyspnea at rest, dyspnea on exertion, cough, sputum, wheezing.  GI: See history of present illness. GU:  Negative for dysuria, hematuria, urinary incontinence, urinary frequency, nocturnal urination.  MS: Negative for joint pain, low back pain.  Derm: Negative for rash or itching.  Neuro: Negative for weakness, abnormal sensation, seizure, frequent headaches, memory loss, confusion.  Psych: Negative for  suicidal ideation, hallucinations.  Positive depression Endo: Negative for unusual weight change.  Heme: Negative for bruising or bleeding. Allergy: Negative for rash or hives.    Physical Examination:  BP 126/79   Pulse 65   Temp (!) 96.9 F (36.1 C) (Temporal)   Ht 6\' 6"  (1.981 m)   Wt 232 lb 3.2 oz (105.3 kg)   BMI 26.83 kg/m    General: Well-nourished, well-developed in no acute distress.  Head: Normocephalic, atraumatic.   Eyes: Conjunctiva pink, no icterus. Mouth:  masked. Neck: Supple without thyromegaly, masses, or lymphadenopathy.  Lungs: Clear to auscultation bilaterally.  Heart: Regular rate and rhythm, no murmurs rubs or gallops.  Abdomen: Bowel sounds are normal, nontender, nondistended, no hepatosplenomegaly or masses, no abdominal bruits or    hernia , no rebound or guarding.   Rectal: not performed Extremities: No lower extremity edema. No clubbing or deformities.  Neuro: Alert and oriented x 4 , grossly normal neurologically.  Skin: Warm and dry, no rash or jaundice.   Psych: Alert and cooperative, normal mood and affect.  Labs: Lab Results  Component Value Date   CREATININE 1.21 04/17/2020   BUN 15 04/17/2020   NA 140 04/17/2020   K 4.7 04/17/2020   CL 102 04/17/2020  CO2 29 04/17/2020   Lab Results  Component Value Date   ALT 18 04/17/2020   AST 19 04/17/2020   ALKPHOS 63 08/14/2019   BILITOT 0.6 04/17/2020   Lab Results  Component Value Date   WBC 6.1 04/17/2020   HGB 13.6 04/17/2020   HCT 39.1 04/17/2020   MCV 96.5 04/17/2020   PLT 194 04/17/2020   Lab Results  Component Value Date   HGBA1C 5.3 04/17/2020   Lab Results  Component Value Date   TSH 1.24 04/17/2020     Imaging Studies: No results found.  Impression/plan:  Pleasant 64 year old male presenting to schedule screening colonoscopy.  His last colonoscopy was in 2008 without any polyps.  He had postponed colonoscopy last year because of kidney cancer, required left nephrectomy.  Colonoscopy with Dr. Gala Romney with propofol due to medication and moderate alcohol use.  I have discussed the risks, alternatives, benefits with regards to but not limited to the risk of reaction to medication, bleeding, infection, perforation and the patient is agreeable to proceed. Written consent to be obtained.

## 2020-07-09 ENCOUNTER — Other Ambulatory Visit: Payer: Self-pay

## 2020-07-09 ENCOUNTER — Other Ambulatory Visit (HOSPITAL_COMMUNITY): Payer: Self-pay | Admitting: Urology

## 2020-07-09 ENCOUNTER — Ambulatory Visit (HOSPITAL_COMMUNITY)
Admission: RE | Admit: 2020-07-09 | Discharge: 2020-07-09 | Disposition: A | Discharge status: Home / Self Care | Payer: 59 | Source: Ambulatory Visit | Attending: Urology | Admitting: Urology

## 2020-07-09 DIAGNOSIS — C642 Malignant neoplasm of left kidney, except renal pelvis: Secondary | ICD-10-CM | POA: Diagnosis not present

## 2020-07-17 ENCOUNTER — Telehealth: Payer: Self-pay | Admitting: *Deleted

## 2020-07-17 ENCOUNTER — Encounter: Payer: Self-pay | Admitting: *Deleted

## 2020-07-17 NOTE — Telephone Encounter (Signed)
Patient was supposed to call back to schedule procedure. No call received. Letter mailed

## 2020-09-03 ENCOUNTER — Other Ambulatory Visit: Payer: Self-pay | Admitting: Internal Medicine

## 2020-09-03 DIAGNOSIS — I10 Essential (primary) hypertension: Secondary | ICD-10-CM

## 2020-09-27 ENCOUNTER — Other Ambulatory Visit: Payer: Self-pay | Admitting: Internal Medicine

## 2020-09-27 DIAGNOSIS — I10 Essential (primary) hypertension: Secondary | ICD-10-CM

## 2020-10-15 ENCOUNTER — Other Ambulatory Visit: Payer: Self-pay

## 2020-10-16 ENCOUNTER — Encounter: Payer: Self-pay | Admitting: Internal Medicine

## 2020-10-16 ENCOUNTER — Ambulatory Visit (INDEPENDENT_AMBULATORY_CARE_PROVIDER_SITE_OTHER): Payer: Medicare Other | Admitting: Internal Medicine

## 2020-10-16 VITALS — BP 130/80 | HR 68 | Temp 98.0°F | Wt 226.0 lb

## 2020-10-16 DIAGNOSIS — N2889 Other specified disorders of kidney and ureter: Secondary | ICD-10-CM

## 2020-10-16 DIAGNOSIS — I10 Essential (primary) hypertension: Secondary | ICD-10-CM | POA: Diagnosis not present

## 2020-10-16 DIAGNOSIS — E559 Vitamin D deficiency, unspecified: Secondary | ICD-10-CM | POA: Diagnosis not present

## 2020-10-16 DIAGNOSIS — R7302 Impaired glucose tolerance (oral): Secondary | ICD-10-CM | POA: Diagnosis not present

## 2020-10-16 DIAGNOSIS — I480 Paroxysmal atrial fibrillation: Secondary | ICD-10-CM

## 2020-10-16 LAB — POCT GLYCOSYLATED HEMOGLOBIN (HGB A1C): Hemoglobin A1C: 5.2 % (ref 4.0–5.6)

## 2020-10-16 NOTE — Progress Notes (Signed)
Established Patient Office Visit     This visit occurred during the SARS-CoV-2 public health emergency.  Safety protocols were in place, including screening questions prior to the visit, additional usage of staff PPE, and extensive cleaning of exam room while observing appropriate contact time as indicated for disinfecting solutions.    CC/Reason for Visit: 74-month follow-up chronic medical conditions  HPI: Thomas Baldwin is a 65 y.o. male who is coming in today for the above mentioned reasons. Past Medical History is significant for: Hypertension, GERD,paroxysmal atrial fibrillation.  He was diagnosed with a left renal cell carcinoma in 2021 and is now status post a left-sided nephrectomy.  He has been doing well and has no acute complaints.  He has had increased social and familial stressors with a wife who has stage IV lung cancer.   Past Medical/Surgical History: Past Medical History:  Diagnosis Date  . Atrial fibrillation ([place])   . Atypical chest pain   . GERD (gastroesophageal reflux disease)    Questionable  . Hematuria none since 07-22-2019  . Hypertension   . Nerve damage    left hand    Past Surgical History:  Procedure Laterality Date  . colonscopy  2010  . CYSTOSCOPY WITH RETROGRADE PYELOGRAM, URETEROSCOPY AND STENT PLACEMENT Bilateral 08/04/2019   Procedure: CYSTOSCOPY WITH BILATERAL  RETROGRADE PYELOGRAM, LEFT DIAGNOSTIC URETEROSCOPY  AND POSSIBLE BIOPSY AND STENT PLACEMENT;  Surgeon: Ardis Hughs, MD;  Location: [facility];  Service: Urology;  Laterality: Bilateral;  . LAPAROSCOPIC NEPHRECTOMY, HAND ASSISTED Left 08/24/2019   Procedure: LAPAROSCOPIC LEFT NEPHRECTOMY;  Surgeon: Ardis Hughs, MD;  Location: [facility];  Service: Urology;  Laterality: Left;  . NOSE SURGERY  1980  . TONSILECTOMY, ADENOIDECTOMY, BILATERAL MYRINGOTOMY AND TUBES      Social History:  reports that he has been smoking cigars. He has never used smokeless  tobacco. He reports current alcohol use of about 21.0 standard drinks of alcohol per week. He reports previous drug use.  Allergies: No Known Allergies  Family History:  Family History  Problem Relation Age of Onset  . Coronary artery disease Other   . Diabetes Other   . Hypertension Other   . Colon cancer Neg Hx      Current Outpatient Medications:  .  acetaminophen (TYLENOL) 325 MG tablet, Take 975 mg by mouth every 6 (six) hours as needed for moderate pain or headache., Disp: , Rfl:  .  amLODipine (NORVASC) 5 MG tablet, Take 1 tablet by mouth once daily, Disp: 90 tablet, Rfl: 1 .  losartan (COZAAR) 100 MG tablet, Take 1 tablet by mouth once daily, Disp: 90 tablet, Rfl: 1 .  metoprolol tartrate (LOPRESSOR) 50 MG tablet, TAKE 1 TABLET BY MOUTH TWICE DAILY - PLEASE SCHEDULE AN OFFICE VISIT FOR MORE REFILLS, Disp: 180 tablet, Rfl: 1 .  Multiple Vitamins-Minerals (MULTIVITAMIN ADULT PO), Take by mouth daily., Disp: , Rfl:  .  omeprazole (PRILOSEC) 20 MG capsule, Take 1 capsule by mouth once daily, Disp: 90 capsule, Rfl: 0 .  traZODone (DESYREL) 150 MG tablet, TAKE 1 TABLET BY MOUTH AT BEDTIME. APPOINTMENT REQUIRED FOR FURTHER REFILLS, Disp: 90 tablet, Rfl: 0  Review of Systems:  Constitutional: Denies fever, chills, diaphoresis, appetite change and fatigue.  HEENT: Denies photophobia, eye pain, redness, hearing loss, ear pain, congestion, sore throat, rhinorrhea, sneezing, mouth sores, trouble swallowing, neck pain, neck stiffness and tinnitus.   Respiratory: Denies SOB, DOE, cough, chest tightness,  and wheezing.  Cardiovascular: Denies chest pain, palpitations and leg swelling.  Gastrointestinal: Denies nausea, vomiting, abdominal pain, diarrhea, constipation, blood in stool and abdominal distention.  Genitourinary: Denies dysuria, urgency, frequency, hematuria, flank pain and difficulty urinating.  Endocrine: Denies: hot or cold intolerance, sweats, changes in hair or nails,  polyuria, polydipsia. Musculoskeletal: Denies myalgias, back pain, joint swelling, arthralgias and gait problem.  Skin: Denies pallor, rash and wound.  Neurological: Denies dizziness, seizures, syncope, weakness, light-headedness, numbness and headaches.  Hematological: Denies adenopathy. Easy bruising, personal or family bleeding history  Psychiatric/Behavioral: Denies suicidal ideation, mood changes, confusion, nervousness, sleep disturbance and agitation    Physical Exam: Vitals:   10/16/20 1031  BP: 130/80  Pulse: 68  Temp: 98 F (36.7 C)  TempSrc: Oral  SpO2: 97%  Weight: 226 lb (102.5 kg)    Body mass index is 26.12 kg/m.   Constitutional: NAD, calm, comfortable Eyes: PERRL, lids and conjunctivae normal ENMT: Mucous membranes are moist.  Respiratory: clear to auscultation bilaterally, no wheezing, no crackles. Normal respiratory effort. No accessory muscle use.  Cardiovascular: Regular rate and rhythm, no murmurs / rubs / gallops. No extremity edema. Neurologic: Grossly intact and nonfocal Psychiatric: Normal judgment and insight. Alert and oriented x 3. Normal mood.    Impression and Plan:  Impaired glucose tolerance  -A1c is 5.2 today.  Continue lifestyle changes.  Essential hypertension -Well-controlled.  Paroxysmal atrial fibrillation ([facility]) -Rate controlled on metoprolol, he has refused anticoagulation in the past.  Kidney mass -Status post left nephrectomy.  Vitamin D deficiency -Recheck levels when he returns for CPE.    Lelon Frohlich, MD  Primary Care at [facility]

## 2020-10-28 ENCOUNTER — Other Ambulatory Visit: Payer: Self-pay | Admitting: Internal Medicine

## 2020-10-28 DIAGNOSIS — I10 Essential (primary) hypertension: Secondary | ICD-10-CM

## 2020-10-31 ENCOUNTER — Other Ambulatory Visit: Payer: Self-pay | Admitting: Internal Medicine

## 2020-10-31 DIAGNOSIS — I10 Essential (primary) hypertension: Secondary | ICD-10-CM

## 2020-11-25 ENCOUNTER — Other Ambulatory Visit: Payer: Self-pay | Admitting: Internal Medicine

## 2020-11-25 DIAGNOSIS — I10 Essential (primary) hypertension: Secondary | ICD-10-CM

## 2020-12-19 IMAGING — CT CT ABDOMEN WO/W CM
3 of 12 series · 11 of 46 positions shown, 17 images · IV contrast (Omnipaque or Isovue)
Comparison: CT stone study earlier same day

CLINICAL DATA: Left flank and left lower quadrant pain.

EXAM:
CT ABDOMEN WITHOUT AND WITH CONTRAST
TECHNIQUE: Multidetector CT imaging of the abdomen was performed following the
standard protocol before and following the bolus administration of
intravenous contrast.
CONTRAST:  125mL OMNIPAQUE IOHEXOL 300 MG/ML  SOLN

[Series 4: coronal pre · coronal · non-contrast · 0.91mm/px · 2 of 115 slices shown, 3 images]
[im 39/115  soft-tissue]
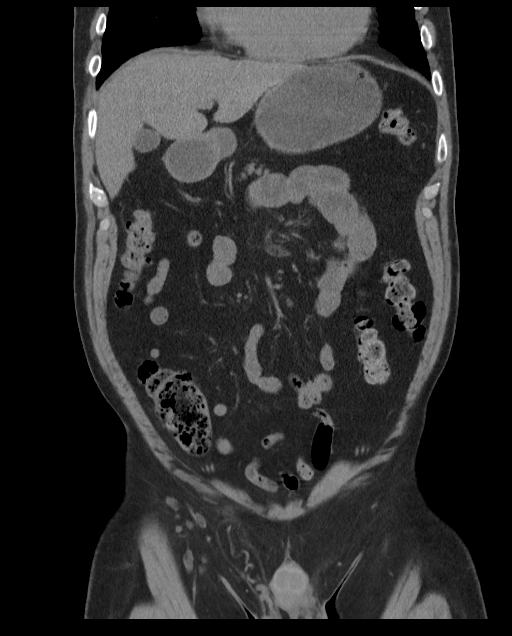
[im 39/115  bone]
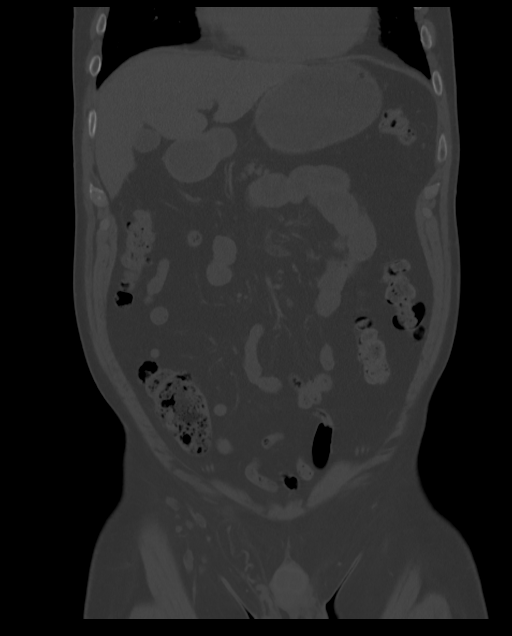
[im 77/115  soft-tissue]
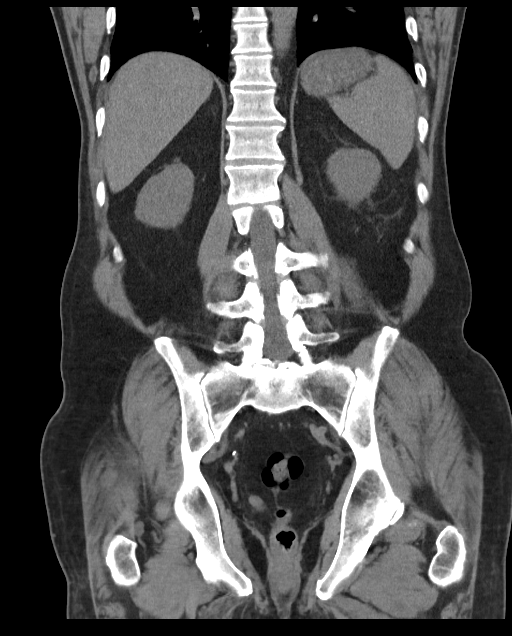

[Series 6: axial arterial · axial · arterial · 0.83mm/px · z∈[+542,+971]mm · 7 of 191 slices shown, 12 images]
[im 24/191  soft-tissue]
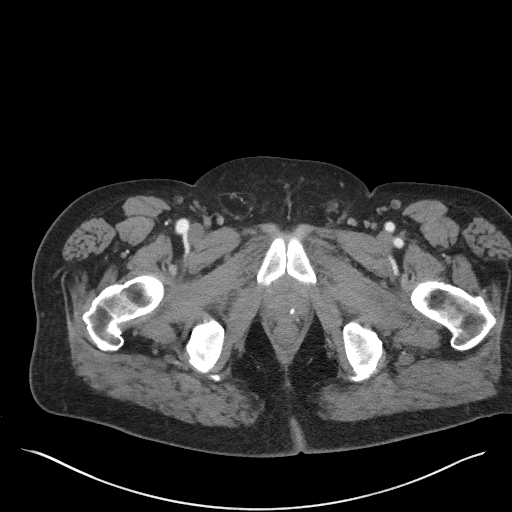
[im 24/191  bone]
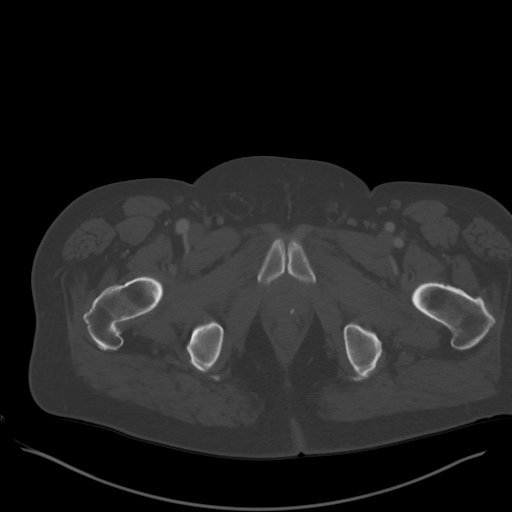
[im 48/191  soft-tissue]
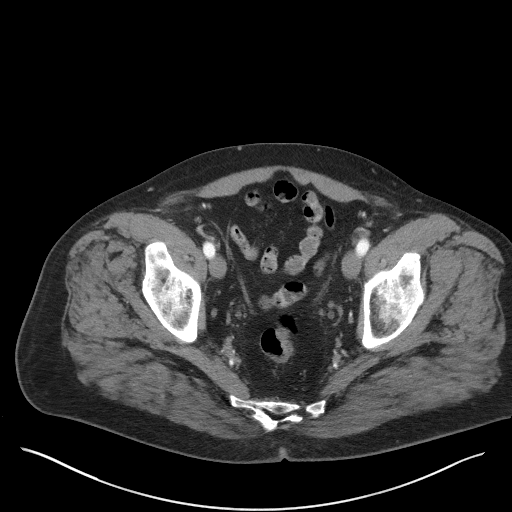
[im 72/191  soft-tissue]
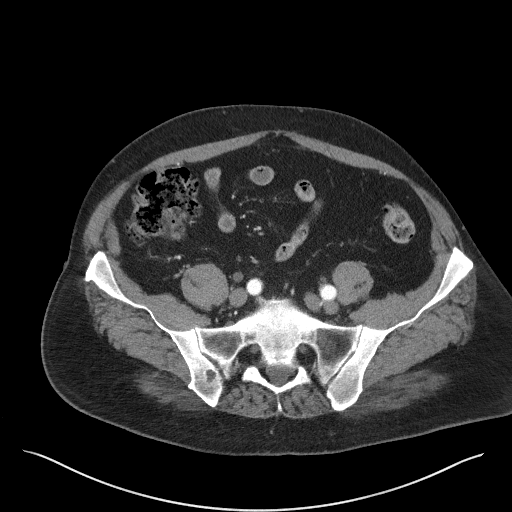
[im 96/191  soft-tissue]
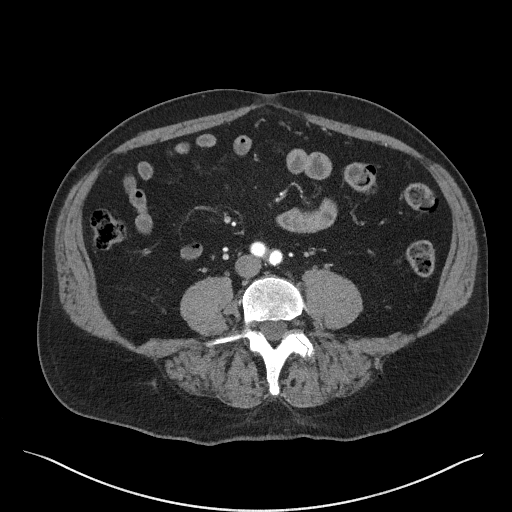
[im 96/191  lung]
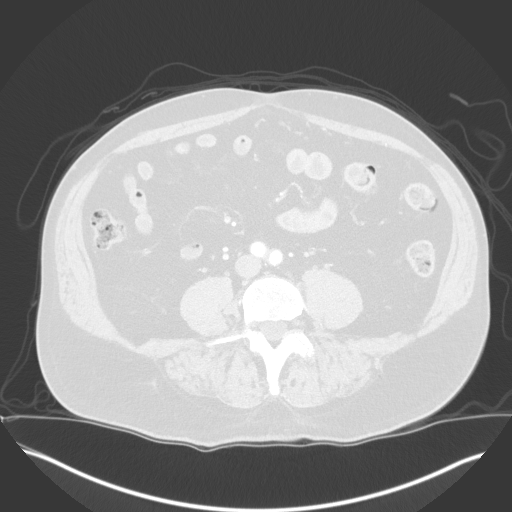
[im 119/191  soft-tissue]
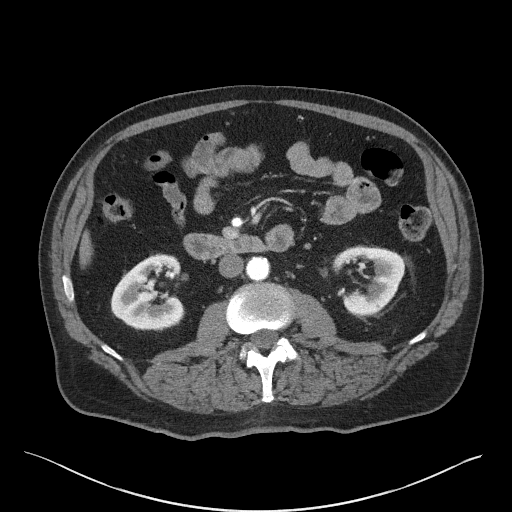
[im 119/191  lung]
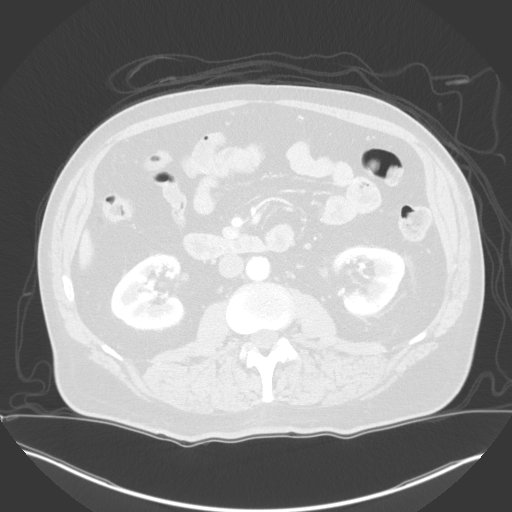
[im 143/191  soft-tissue]
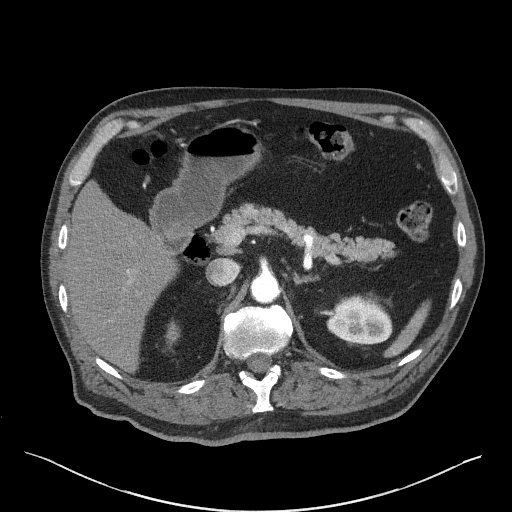
[im 143/191  lung]
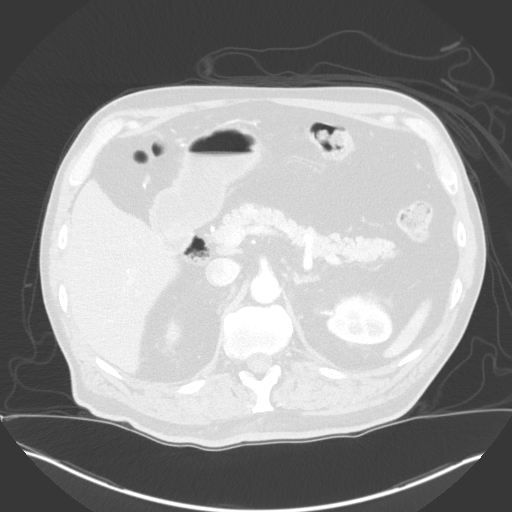
[im 167/191  soft-tissue]
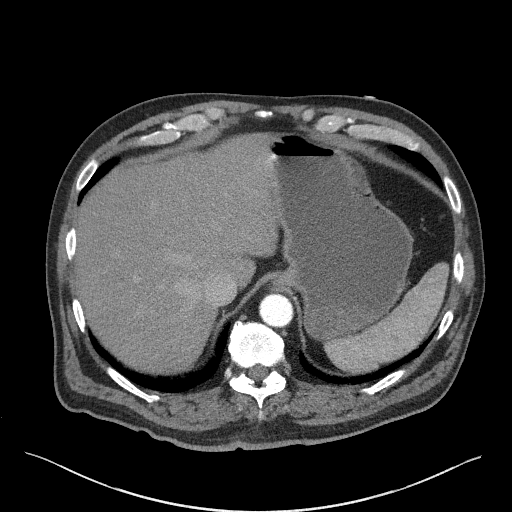
[im 167/191  lung]
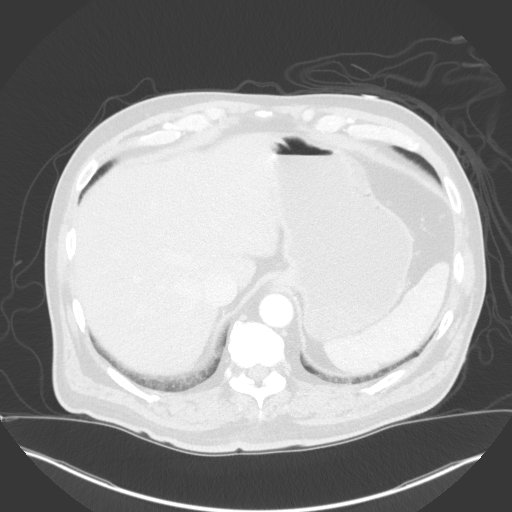

[Series 11: axial nephro · axial · 0.82mm/px · z∈[+542,+614]mm · 2 of 191 slices shown]
[im 24/191  soft-tissue]
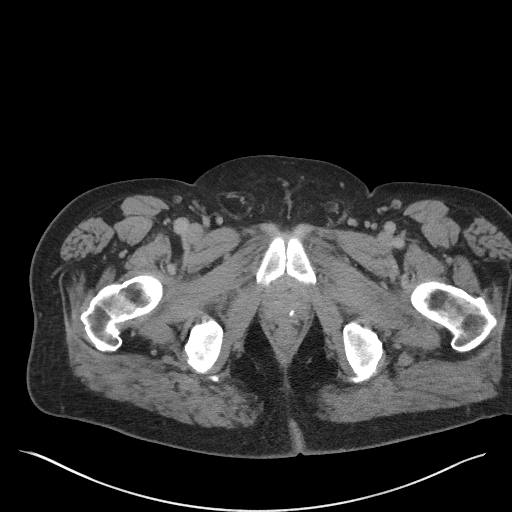
[im 48/191  soft-tissue]
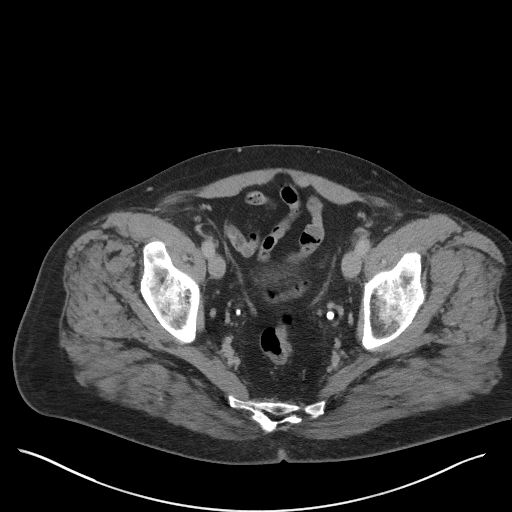

[11 of 46 positions shown; findings below may reference images not displayed]

FINDINGS: Lower chest: Basilar atelectasis.

Hepatobiliary: No suspicious focal abnormality within the liver
parenchyma. The liver shows diffusely decreased attenuation
suggesting fat deposition. Tiny calcified gallstones noted. No
intrahepatic or extrahepatic biliary dilation.

Pancreas: No focal mass lesion. No dilatation of the main duct. No
intraparenchymal cyst. No peripancreatic edema.

Spleen: No splenomegaly. No focal mass lesion.

Adrenals/Urinary Tract: No adrenal nodule or mass. Tiny stone noted
lower pole right kidney better seen on previous noncontrast CT. No
suspicious right renal mass lesion. Right ureter is unremarkable.

Subtle hypoattenuating lesion seen in the interpolar left kidney on
the previous study shows medial hyperenhancement on early phase
imaging with diffuse, heterogeneous hypoenhancement on delayed
imaging. This lesion measures 2.7 x 3.7 x 2.6 cm in the interpolar
region, wedged between calices. Medial aspect of this lesion extends
into the central sinus fat. 17 mm exophytic cyst noted anterior
interpolar left kidney. Left renal vein patent without filling
defect. Left ureter unremarkable.

Bladder unremarkable.

Stomach/Bowel: Moderate gastric distension. Duodenum is normally
positioned as is the ligament of Treitz. Duodenal diverticulum
noted. No small bowel wall thickening. No small bowel dilatation.
The terminal ileum is normal. The appendix is normal. No gross
colonic mass. No colonic wall thickening.

Vascular/Lymphatic: There is abdominal aortic atherosclerosis
without aneurysm. Portal vein is patent. There is no gastrohepatic
or hepatoduodenal ligament lymphadenopathy. No retroperitoneal or
mesenteric lymphadenopathy. No pelvic sidewall lymphadenopathy.

Other: No intraperitoneal free fluid.

Musculoskeletal: Small right groin hernia contains only fat. Small
lucency posterior right iliac bone, likely benign. No worrisome
lytic or sclerotic osseous abnormality.
IMPRESSION: 1. 3.7 cm lesion of question on noncontrast CT earlier today
demonstrates heterogeneous enhancement after IV contrast on today's
study. Delayed imaging shows hypoenhancement relative to background
renal parenchyma. Imaging features consistent with neoplasm. Lesion
extends into the central sinus fat in generates some mass-effect on
adjacent calices. No filling defect in the left renal vein. No
abdominal retroperitoneal lymphadenopathy or evidence of metastatic
disease elsewhere in the abdomen/pelvis.
2. Punctate nonobstructing right renal stone.
3. Cholelithiasis.
4.  Aortic Atherosclerois ([id number]-[id number])

## 2020-12-24 DIAGNOSIS — C642 Malignant neoplasm of left kidney, except renal pelvis: Secondary | ICD-10-CM | POA: Diagnosis not present

## 2020-12-27 ENCOUNTER — Other Ambulatory Visit: Payer: Self-pay

## 2020-12-27 ENCOUNTER — Ambulatory Visit (HOSPITAL_COMMUNITY)
Admission: RE | Admit: 2020-12-27 | Discharge: 2020-12-27 | Disposition: A | Discharge status: Home / Self Care | Payer: Medicare Other | Source: Ambulatory Visit | Attending: Urology | Admitting: Urology

## 2020-12-27 ENCOUNTER — Other Ambulatory Visit (HOSPITAL_COMMUNITY): Payer: Self-pay | Admitting: Urology

## 2020-12-27 DIAGNOSIS — C642 Malignant neoplasm of left kidney, except renal pelvis: Secondary | ICD-10-CM

## 2020-12-27 DIAGNOSIS — K8020 Calculus of gallbladder without cholecystitis without obstruction: Secondary | ICD-10-CM | POA: Diagnosis not present

## 2020-12-27 DIAGNOSIS — N200 Calculus of kidney: Secondary | ICD-10-CM | POA: Diagnosis not present

## 2020-12-31 DIAGNOSIS — C642 Malignant neoplasm of left kidney, except renal pelvis: Secondary | ICD-10-CM | POA: Diagnosis not present

## 2021-01-15 ENCOUNTER — Other Ambulatory Visit: Payer: Self-pay | Admitting: Internal Medicine

## 2021-01-15 DIAGNOSIS — I10 Essential (primary) hypertension: Secondary | ICD-10-CM

## 2021-02-25 ENCOUNTER — Other Ambulatory Visit: Payer: Self-pay | Admitting: Internal Medicine

## 2021-02-25 DIAGNOSIS — I10 Essential (primary) hypertension: Secondary | ICD-10-CM

## 2021-03-13 ENCOUNTER — Other Ambulatory Visit: Payer: Self-pay | Admitting: Internal Medicine

## 2021-03-13 DIAGNOSIS — I10 Essential (primary) hypertension: Secondary | ICD-10-CM

## 2021-05-19 ENCOUNTER — Other Ambulatory Visit: Payer: Self-pay | Admitting: Internal Medicine

## 2021-05-19 DIAGNOSIS — I10 Essential (primary) hypertension: Secondary | ICD-10-CM

## 2021-05-22 ENCOUNTER — Other Ambulatory Visit: Payer: Self-pay | Admitting: Internal Medicine

## 2021-05-22 DIAGNOSIS — I10 Essential (primary) hypertension: Secondary | ICD-10-CM

## 2021-06-12 LAB — HM HEPATITIS C SCREENING LAB: HM Hepatitis Screen: NEGATIVE

## 2021-06-17 ENCOUNTER — Other Ambulatory Visit: Payer: Self-pay | Admitting: Internal Medicine

## 2021-06-17 DIAGNOSIS — I10 Essential (primary) hypertension: Secondary | ICD-10-CM

## 2021-06-24 ENCOUNTER — Other Ambulatory Visit: Payer: Self-pay | Admitting: *Deleted

## 2021-06-24 DIAGNOSIS — I10 Essential (primary) hypertension: Secondary | ICD-10-CM

## 2021-06-24 MED ORDER — TRAZODONE HCL 150 MG PO TABS: ORAL_TABLET | ORAL | 0 refills | Status: DC

## 2021-06-24 MED ORDER — METOPROLOL TARTRATE 50 MG PO TABS: ORAL_TABLET | ORAL | 0 refills | Status: DC

## 2021-06-24 MED ORDER — AMLODIPINE BESYLATE 5 MG PO TABS: 5.0000 mg | ORAL_TABLET | Freq: Every day | ORAL | 0 refills | Status: DC

## 2021-06-24 MED ORDER — LOSARTAN POTASSIUM 100 MG PO TABS: 100.0000 mg | ORAL_TABLET | Freq: Every day | ORAL | 0 refills | Status: DC

## 2021-06-24 MED ORDER — OMEPRAZOLE 20 MG PO CPDR: 20.0000 mg | DELAYED_RELEASE_CAPSULE | Freq: Every day | ORAL | 0 refills | Status: DC

## 2021-07-01 DIAGNOSIS — C649 Malignant neoplasm of unspecified kidney, except renal pelvis: Secondary | ICD-10-CM | POA: Diagnosis not present

## 2021-07-01 DIAGNOSIS — K8020 Calculus of gallbladder without cholecystitis without obstruction: Secondary | ICD-10-CM | POA: Diagnosis not present

## 2021-07-01 DIAGNOSIS — R160 Hepatomegaly, not elsewhere classified: Secondary | ICD-10-CM | POA: Diagnosis not present

## 2021-07-01 DIAGNOSIS — N200 Calculus of kidney: Secondary | ICD-10-CM | POA: Diagnosis not present

## 2021-07-01 DIAGNOSIS — C642 Malignant neoplasm of left kidney, except renal pelvis: Secondary | ICD-10-CM | POA: Diagnosis not present

## 2021-08-05 DIAGNOSIS — C642 Malignant neoplasm of left kidney, except renal pelvis: Secondary | ICD-10-CM | POA: Diagnosis not present

## 2021-08-25 ENCOUNTER — Other Ambulatory Visit: Payer: Self-pay | Admitting: Internal Medicine

## 2021-08-25 DIAGNOSIS — I10 Essential (primary) hypertension: Secondary | ICD-10-CM

## 2021-09-18 ENCOUNTER — Encounter: Payer: Self-pay | Admitting: Internal Medicine

## 2021-09-27 ENCOUNTER — Other Ambulatory Visit: Payer: Self-pay | Admitting: Internal Medicine

## 2021-09-27 DIAGNOSIS — I10 Essential (primary) hypertension: Secondary | ICD-10-CM

## 2021-10-20 ENCOUNTER — Other Ambulatory Visit: Payer: Self-pay | Admitting: Internal Medicine

## 2021-10-20 DIAGNOSIS — I10 Essential (primary) hypertension: Secondary | ICD-10-CM

## 2022-01-04 ENCOUNTER — Other Ambulatory Visit: Payer: Self-pay | Admitting: Internal Medicine

## 2022-01-04 DIAGNOSIS — I10 Essential (primary) hypertension: Secondary | ICD-10-CM

## 2022-01-26 DIAGNOSIS — C642 Malignant neoplasm of left kidney, except renal pelvis: Secondary | ICD-10-CM | POA: Diagnosis not present

## 2022-01-28 ENCOUNTER — Ambulatory Visit (HOSPITAL_COMMUNITY)
Admission: RE | Admit: 2022-01-28 | Discharge: 2022-01-28 | Disposition: A | Discharge status: Home / Self Care | Payer: Medicare Other | Source: Ambulatory Visit | Attending: Urology | Admitting: Urology

## 2022-01-28 ENCOUNTER — Other Ambulatory Visit (HOSPITAL_COMMUNITY): Payer: Self-pay | Admitting: Urology

## 2022-01-28 DIAGNOSIS — C642 Malignant neoplasm of left kidney, except renal pelvis: Secondary | ICD-10-CM

## 2022-01-28 DIAGNOSIS — K760 Fatty (change of) liver, not elsewhere classified: Secondary | ICD-10-CM | POA: Diagnosis not present

## 2022-01-28 DIAGNOSIS — K8020 Calculus of gallbladder without cholecystitis without obstruction: Secondary | ICD-10-CM | POA: Diagnosis not present

## 2022-02-02 DIAGNOSIS — C642 Malignant neoplasm of left kidney, except renal pelvis: Secondary | ICD-10-CM | POA: Diagnosis not present

## 2022-02-25 ENCOUNTER — Other Ambulatory Visit: Payer: Self-pay | Admitting: Internal Medicine

## 2022-02-25 DIAGNOSIS — I10 Essential (primary) hypertension: Secondary | ICD-10-CM

## 2022-02-25 MED ORDER — TRAZODONE HCL 150 MG PO TABS: 150.0000 mg | ORAL_TABLET | Freq: Every day | ORAL | 0 refills | Status: DC

## 2022-02-25 NOTE — Telephone Encounter (Signed)
Refill sent.

## 2022-02-25 NOTE — Telephone Encounter (Signed)
Pt is calling and has cpe sch 03-19-2022 and would like a refill on traZODone (DESYREL) 150 MG tablet  [facility] ([facility]) - [place], [place] Phone:  [phone]  Fax:  [phone]

## 2022-03-19 ENCOUNTER — Ambulatory Visit (INDEPENDENT_AMBULATORY_CARE_PROVIDER_SITE_OTHER): Payer: Medicare Other | Admitting: Internal Medicine

## 2022-03-19 ENCOUNTER — Encounter: Payer: Self-pay | Admitting: Internal Medicine

## 2022-03-19 VITALS — BP 130/80 | HR 75 | Temp 97.9°F | Ht 77.5 in | Wt 228.6 lb

## 2022-03-19 DIAGNOSIS — R7302 Impaired glucose tolerance (oral): Secondary | ICD-10-CM | POA: Diagnosis not present

## 2022-03-19 DIAGNOSIS — I480 Paroxysmal atrial fibrillation: Secondary | ICD-10-CM

## 2022-03-19 DIAGNOSIS — E559 Vitamin D deficiency, unspecified: Secondary | ICD-10-CM | POA: Diagnosis not present

## 2022-03-19 DIAGNOSIS — Z1211 Encounter for screening for malignant neoplasm of colon: Secondary | ICD-10-CM

## 2022-03-19 DIAGNOSIS — I10 Essential (primary) hypertension: Secondary | ICD-10-CM | POA: Diagnosis not present

## 2022-03-19 DIAGNOSIS — Z23 Encounter for immunization: Secondary | ICD-10-CM

## 2022-03-19 DIAGNOSIS — Z0000 Encounter for general adult medical examination without abnormal findings: Secondary | ICD-10-CM | POA: Diagnosis not present

## 2022-03-19 LAB — COMPREHENSIVE METABOLIC PANEL
ALT: 27 U/L (ref 0–53)
AST: 24 U/L (ref 0–37)
Albumin: 4.3 g/dL (ref 3.5–5.2)
Alkaline Phosphatase: 73 U/L (ref 39–117)
BUN: 15 mg/dL (ref 6–23)
CO2: 33 mEq/L — ABNORMAL HIGH (ref 19–32)
Calcium: 10 mg/dL (ref 8.4–10.5)
Chloride: 101 mEq/L (ref 96–112)
Creatinine, Ser: 1.36 mg/dL (ref 0.40–1.50)
GFR: 54.15 mL/min — ABNORMAL LOW (ref >60.00)
Glucose, Bld: 95 mg/dL (ref 70–99)
Potassium: 4.7 mEq/L (ref 3.5–5.1)
Sodium: 140 mEq/L (ref 135–145)
Total Bilirubin: 0.8 mg/dL (ref 0.2–1.2)
Total Protein: 7.3 g/dL (ref 6.0–8.3)

## 2022-03-19 LAB — PSA: PSA: 0.97 ng/mL (ref 0.10–4.00)

## 2022-03-19 LAB — LIPID PANEL
Cholesterol: 147 mg/dL (ref 0–200)
HDL: 40.6 mg/dL (ref >39.00)
LDL Cholesterol: 71 mg/dL (ref 0–99)
NonHDL: 106.56
Total CHOL/HDL Ratio: 4
Triglycerides: 179 mg/dL — ABNORMAL HIGH (ref 0.0–149.0)
VLDL: 35.8 mg/dL (ref 0.0–40.0)

## 2022-03-19 LAB — CBC WITH DIFFERENTIAL/PLATELET
Basophils Absolute: 0 10*3/uL (ref 0.0–0.1)
Basophils Relative: 0.5 % (ref 0.0–3.0)
Eosinophils Absolute: 0.3 10*3/uL (ref 0.0–0.7)
Eosinophils Relative: 4.2 % (ref 0.0–5.0)
HCT: 43.7 % (ref 39.0–52.0)
Hemoglobin: 15.1 g/dL (ref 13.0–17.0)
Lymphocytes Relative: 15.2 % (ref 12.0–46.0)
Lymphs Abs: 1 10*3/uL (ref 0.7–4.0)
MCHC: 34.5 g/dL (ref 30.0–36.0)
MCV: 95.9 fl (ref 78.0–100.0)
Monocytes Absolute: 0.7 10*3/uL (ref 0.1–1.0)
Monocytes Relative: 10.9 % (ref 3.0–12.0)
Neutro Abs: 4.4 10*3/uL (ref 1.4–7.7)
Neutrophils Relative %: 69.2 % (ref 43.0–77.0)
Platelets: 179 10*3/uL (ref 150.0–400.0)
RBC: 4.56 Mil/uL (ref 4.22–5.81)
RDW: 12.7 % (ref 11.5–15.5)
WBC: 6.3 10*3/uL (ref 4.0–10.5)

## 2022-03-19 LAB — HEMOGLOBIN A1C: Hgb A1c MFr Bld: 5.6 % (ref 4.6–6.5)

## 2022-03-19 LAB — VITAMIN D 25 HYDROXY (VIT D DEFICIENCY, FRACTURES): VITD: 26.44 ng/mL — ABNORMAL LOW (ref 30.00–100.00)

## 2022-03-19 LAB — TSH: TSH: 0.74 u[IU]/mL (ref 0.35–5.50)

## 2022-03-19 LAB — VITAMIN B12: Vitamin B-12: 377 pg/mL (ref 211–911)

## 2022-03-19 MED ORDER — OMEPRAZOLE 20 MG PO CPDR: 20.0000 mg | DELAYED_RELEASE_CAPSULE | Freq: Every day | ORAL | 1 refills | Status: DC

## 2022-03-19 NOTE — Progress Notes (Signed)
Established Patient Office Visit     CC/Reason for Visit: Annual preventive exam and subsequent Medicare wellness visit  HPI: Thomas Baldwin is a 67 y.o. male who is coming in today for the above mentioned reasons. Past Medical History is significant for: Hypertension, GERD, vitamin D deficiency, A-fib, left renal cell carcinoma status post left nephrectomy in 2021.  He is overdue for an eye exam, has routine dental care, no perceived hearing issues.  He is due for COVID, flu, and vaccines.  He has never had colorectal cancer screening.   Past Medical/Surgical History: Past Medical History:  Diagnosis Date   Atrial fibrillation ([place])    Atypical chest pain    GERD (gastroesophageal reflux disease)    Questionable   Hematuria none since 07-22-2019   Hypertension    Nerve damage    left hand    Past Surgical History:  Procedure Laterality Date   colonscopy  2010   CYSTOSCOPY WITH RETROGRADE PYELOGRAM, URETEROSCOPY AND STENT PLACEMENT Bilateral 08/04/2019   Procedure: CYSTOSCOPY WITH BILATERAL  RETROGRADE PYELOGRAM, LEFT DIAGNOSTIC URETEROSCOPY  AND POSSIBLE BIOPSY AND STENT PLACEMENT;  Surgeon: Ardis Hughs, MD;  Location: [facility];  Service: Urology;  Laterality: Bilateral;   LAPAROSCOPIC NEPHRECTOMY, HAND ASSISTED Left 08/24/2019   Procedure: LAPAROSCOPIC LEFT NEPHRECTOMY;  Surgeon: Ardis Hughs, MD;  Location: [facility];  Service: Urology;  Laterality: Left;   NOSE SURGERY  1980   TONSILECTOMY, ADENOIDECTOMY, BILATERAL MYRINGOTOMY AND TUBES      Social History:  reports that he has been smoking cigars. He has never used smokeless tobacco. He reports current alcohol use of about 21.0 standard drinks of alcohol per week. He reports that he does not currently use drugs.  Allergies: No Known Allergies  Family History:  Family History  Problem Relation Age of Onset   Coronary artery disease Other    Diabetes Other    Hypertension Other     Colon cancer Neg Hx      Current Outpatient Medications:    acetaminophen (TYLENOL) 325 MG tablet, Take 975 mg by mouth every 6 (six) hours as needed for moderate pain or headache., Disp: , Rfl:    amLODipine (NORVASC) 5 MG tablet, TAKE 1 TABLET BY MOUTH DAILY, Disp: 90 tablet, Rfl: 0   losartan (COZAAR) 100 MG tablet, TAKE 1 TABLET BY MOUTH DAILY, Disp: 90 tablet, Rfl: 0   metoprolol tartrate (LOPRESSOR) 50 MG tablet, TAKE 1 TABLET BY MOUTH TWICE  DAILY, Disp: 180 tablet, Rfl: 0   Multiple Vitamins-Minerals (MULTIVITAMIN ADULT PO), Take by mouth daily., Disp: , Rfl:    traZODone (DESYREL) 150 MG tablet, Take 1 tablet (150 mg total) by mouth at bedtime., Disp: 90 tablet, Rfl: 0   omeprazole (PRILOSEC) 20 MG capsule, Take 1 capsule (20 mg total) by mouth daily., Disp: 90 capsule, Rfl: 1  Review of Systems:  Constitutional: Denies fever, chills, diaphoresis, appetite change and fatigue.  HEENT: Denies photophobia, eye pain, redness, hearing loss, ear pain, congestion, sore throat, rhinorrhea, sneezing, mouth sores, trouble swallowing, neck pain, neck stiffness and tinnitus.   Respiratory: Denies SOB, DOE, cough, chest tightness,  and wheezing.   Cardiovascular: Denies chest pain, palpitations and leg swelling.  Gastrointestinal: Denies nausea, vomiting, abdominal pain, diarrhea, constipation, blood in stool and abdominal distention.  Genitourinary: Denies dysuria, urgency, frequency, hematuria, flank pain and difficulty urinating.  Endocrine: Denies: hot or cold intolerance, sweats, changes in hair or nails, polyuria, polydipsia. Musculoskeletal: Denies myalgias,  back pain, joint swelling, arthralgias and gait problem.  Skin: Denies pallor, rash and wound.  Neurological: Denies dizziness, seizures, syncope, weakness, light-headedness, numbness and headaches.  Hematological: Denies adenopathy. Easy bruising, personal or family bleeding history  Psychiatric/Behavioral: Denies suicidal ideation,  mood changes, confusion, nervousness, sleep disturbance and agitation    Physical Exam: Vitals:   03/19/22 1306  BP: 130/80  Pulse: 75  Temp: 97.9 F (36.6 C)  TempSrc: Oral  SpO2: 98%  Weight: 228 lb 9.6 oz (103.7 kg)  Height: 6' 5.5" (1.969 m)    Body mass index is 26.76 kg/m.   Constitutional: NAD, calm, comfortable Eyes: PERRL, lids and conjunctivae normal ENMT: Mucous membranes are moist. Posterior pharynx clear of any exudate or lesions. Normal dentition. Tympanic membrane is pearly white, no erythema or bulging. Neck: normal, supple, no masses, no thyromegaly Respiratory: clear to auscultation bilaterally, no wheezing, no crackles. Normal respiratory effort. No accessory muscle use.  Cardiovascular: Regular rate and rhythm, no murmurs / rubs / gallops. No extremity edema. 2+ pedal pulses. No carotid bruits.  Abdomen: no tenderness, no masses palpated. No hepatosplenomegaly. Bowel sounds positive.  Musculoskeletal: no clubbing / cyanosis. No joint deformity upper and lower extremities. Good ROM, no contractures. Normal muscle tone.  Skin: no rashes, lesions, ulcers. No induration Neurologic: CN 2-12 grossly intact. Sensation intact, DTR normal. Strength 5/5 in all 4.  Psychiatric: Normal judgment and insight. Alert and oriented x 3. Normal mood.    Subsequent Medicare wellness visit   1. Risk factors, based on past  M,S,F -cardiovascular disease risk factors include age, gender, history of hypertension   2.  Physical activities: Just household activities   3.  Depression/mood: He has been feeling depressed as his wife has been diagnosed with metastatic cancer   4.  Hearing: Minor difficulties   5.  ADL's: Ended in [place] all ADLs   6.  Fall risk: Low fall risk   7.  Home safety: No problems identified   8.  Height weight, and visual acuity: height and weight as above, vision:  Vision Screening - Comments:: Patient did not bring his glasses   9.  Counseling:  Advised that he update preventative health care as recommended today   10. Lab orders based on risk factors: Laboratory update will be reviewed   11. Referral : None today   12. Care plan: Follow-up with me in 6 months   13. Cognitive assessment: No cognitive impairment   14. Screening: Patient provided with a written and personalized 5-10 year screening schedule in the AVS. yes   15. Provider List Update: PCP, GI  16. Advance Directives: Full code   17. Opioids: Patient is not on any opioid prescriptions and has no risk factors for a substance use disorder.   [place] Office Visit from 03/19/2022 in [place] at [place]  PHQ-9 Total Score 7          08/24/2019    8:00 PM 08/25/2019    8:41 AM 02/21/2020    3:33 PM 04/17/2020   10:02 AM 03/19/2022    1:02 PM  [place] in the past year?   0 0 0  Was there an injury with Fall?   0 0 0  Fall Risk Category Calculator   0 0 0  Fall Risk Category   Low Low Low  Patient Fall Risk Level Moderate fall risk Moderate fall risk   Low fall risk  Patient at Risk for Falls Due to  No Fall Risks  Fall risk Follow up     Falls evaluation completed     Impression and Plan:  Encounter for preventive health examination - Plan: PSA  Essential hypertension - Plan: omeprazole (PRILOSEC) 20 MG capsule  Screening for malignant neoplasm of colon - Plan: Ambulatory referral to Gastroenterology  Need for influenza vaccination  Vitamin D deficiency  Paroxysmal atrial fibrillation ([place]) - Plan: CBC with Differential/Platelet, Comprehensive metabolic panel, Lipid panel, TSH, Vitamin B12, VITAMIN D 25 Hydroxy (Vit-D Deficiency, Fractures)  Impaired glucose tolerance - Plan: Hemoglobin A1c  -Recommend routine eye and dental care. -Immunizations: Flu vaccine in office provided, due for COVID and pneumonia but declines at all today despite counseling -Healthy lifestyle discussed in detail. -Labs to be updated  today. -Colon cancer screening: Overdue, GI referral placed -Breast cancer screening: Not applicable -Cervical cancer screening: Not applicable -Lung cancer screening: Not applicable -Prostate cancer screening: PSA today -DEXA: Not applicable    Patient Instructions  -Nice seeing you today!!  -Lab work today; will notify you once results are available.  -Flu vaccine today.  -Consider COVID and pneumonia vaccines.  -Schedule follow up in 6 months.      Lelon Frohlich, MD [facility] at [facility]

## 2022-03-19 NOTE — Patient Instructions (Signed)
-  Nice seeing you today!!  -Lab work today; will notify you once results are available.  -Flu vaccine today.  -Consider COVID and pneumonia vaccines.  -Schedule follow up in 6 months.

## 2022-03-19 NOTE — Addendum Note (Signed)
Addended by: Westley Hummer B on: 03/19/2022 01:45 PM   Modules accepted: Orders

## 2022-03-26 ENCOUNTER — Other Ambulatory Visit: Payer: Self-pay | Admitting: *Deleted

## 2022-03-26 ENCOUNTER — Other Ambulatory Visit: Payer: Self-pay | Admitting: Internal Medicine

## 2022-03-26 DIAGNOSIS — E559 Vitamin D deficiency, unspecified: Secondary | ICD-10-CM

## 2022-03-26 MED ORDER — VITAMIN D (ERGOCALCIFEROL) 1.25 MG (50000 UNIT) PO CAPS: 50000.0000 [IU] | ORAL_CAPSULE | ORAL | 0 refills | Status: AC

## 2022-04-06 ENCOUNTER — Ambulatory Visit (INDEPENDENT_AMBULATORY_CARE_PROVIDER_SITE_OTHER): Payer: Medicare Other | Admitting: Ophthalmology

## 2022-04-06 ENCOUNTER — Encounter (INDEPENDENT_AMBULATORY_CARE_PROVIDER_SITE_OTHER): Payer: Self-pay | Admitting: Ophthalmology

## 2022-04-06 DIAGNOSIS — H43811 Vitreous degeneration, right eye: Secondary | ICD-10-CM

## 2022-04-06 DIAGNOSIS — H35372 Puckering of macula, left eye: Secondary | ICD-10-CM | POA: Diagnosis not present

## 2022-04-06 DIAGNOSIS — H26491 Other secondary cataract, right eye: Secondary | ICD-10-CM | POA: Diagnosis not present

## 2022-04-06 DIAGNOSIS — H33301 Unspecified retinal break, right eye: Secondary | ICD-10-CM

## 2022-04-06 NOTE — Assessment & Plan Note (Signed)
No new retinal breaks

## 2022-04-06 NOTE — Assessment & Plan Note (Signed)
Stable over time. 

## 2022-04-06 NOTE — Progress Notes (Signed)
04/06/2022     CHIEF COMPLAINT Patient presents for  Chief Complaint  Patient presents with   Posterior Vitreous Detachment      HISTORY OF PRESENT ILLNESS: Thomas Baldwin is a 66 y.o. male who presents to the clinic today for:   HPI   2 yr fu ou oct. Pt stated, "My vision is not as sharp as it used to be. I'm not seeing as clear. Ive already had both cataracts removed and I haven't worn glasses in a long time. So maybe its just time catching up to me." Pt reports vision is stable.  Last edited by Silvestre Moment on 04/06/2022 10:06 AM.      Referring physician: Greta Doom, OD [address] [place],  [place] [address]  HISTORICAL INFORMATION:   Selected notes from the MEDICAL RECORD NUMBER    Lab Results  Component Value Date   HGBA1C 5.6 03/19/2022     CURRENT MEDICATIONS: No current outpatient medications on file. (Ophthalmic Drugs)   No current facility-administered medications for this visit. (Ophthalmic Drugs)   Current Outpatient Medications (Other)  Medication Sig   acetaminophen (TYLENOL) 325 MG tablet Take 975 mg by mouth every 6 (six) hours as needed for moderate pain or headache.   amLODipine (NORVASC) 5 MG tablet TAKE 1 TABLET BY MOUTH DAILY   losartan (COZAAR) 100 MG tablet TAKE 1 TABLET BY MOUTH DAILY   metoprolol tartrate (LOPRESSOR) 50 MG tablet TAKE 1 TABLET BY MOUTH TWICE  DAILY   Multiple Vitamins-Minerals (MULTIVITAMIN ADULT PO) Take by mouth daily.   omeprazole (PRILOSEC) 20 MG capsule Take 1 capsule (20 mg total) by mouth daily.   traZODone (DESYREL) 150 MG tablet Take 1 tablet (150 mg total) by mouth at bedtime.   Vitamin D, Ergocalciferol, (DRISDOL) 1.25 MG (50000 UNIT) CAPS capsule Take 1 capsule (50,000 Units total) by mouth every 7 (seven) days for 12 doses.   No current facility-administered medications for this visit. (Other)      REVIEW OF SYSTEMS: ROS   Negative for: Constitutional, Gastrointestinal, Neurological, Skin,  Genitourinary, Musculoskeletal, HENT, Endocrine, Cardiovascular, Eyes, Respiratory, Psychiatric, Allergic/Imm, Heme/Lymph Last edited by Silvestre Moment on 04/06/2022 10:06 AM.       ALLERGIES No Known Allergies  PAST MEDICAL HISTORY Past Medical History:  Diagnosis Date   Atrial fibrillation ([place])    Atypical chest pain    GERD (gastroesophageal reflux disease)    Questionable   Hematuria none since 07-22-2019   Hypertension    Nerve damage    left hand   Past Surgical History:  Procedure Laterality Date   colonscopy  2010   CYSTOSCOPY WITH RETROGRADE PYELOGRAM, URETEROSCOPY AND STENT PLACEMENT Bilateral 08/04/2019   Procedure: CYSTOSCOPY WITH BILATERAL  RETROGRADE PYELOGRAM, LEFT DIAGNOSTIC URETEROSCOPY  AND POSSIBLE BIOPSY AND STENT PLACEMENT;  Surgeon: Ardis Hughs, MD;  Location: [facility];  Service: Urology;  Laterality: Bilateral;   LAPAROSCOPIC NEPHRECTOMY, HAND ASSISTED Left 08/24/2019   Procedure: LAPAROSCOPIC LEFT NEPHRECTOMY;  Surgeon: Ardis Hughs, MD;  Location[facility];  Service: Urology;  Laterality: Left;   NOSE SURGERY  1980   TONSILECTOMY, ADENOIDECTOMY, BILATERAL MYRINGOTOMY AND TUBES      FAMILY HISTORY Family History  Problem Relation Age of Onset   Coronary artery disease Other    Diabetes Other    Hypertension Other    Colon cancer Neg Hx     SOCIAL HISTORY Social History   Tobacco Use   Smoking status: Some Days  Types: Cigars   Smokeless tobacco: Never   Tobacco comments:    occ  Vaping Use   Vaping Use: Never used  Substance Use Topics   Alcohol use: Yes    Alcohol/week: 21.0 standard drinks of alcohol    Types: 21 Cans of beer per week    Comment: approx 3 beers/day   Drug use: Not Currently         OPHTHALMIC EXAM:  Base Eye Exam     Visual Acuity (ETDRS)       Right Left   Dist [place] 20/25 -1 20/25 -2         Tonometry (Tonopen, 10:10 AM)       Right Left   Pressure 16 16         Pupils        Pupils APD   Right PERRL None   Left PERRL None         Visual Fields       Left Right    Full Full         Extraocular Movement       Right Left    Full, Ortho Full, Ortho         Neuro/Psych     Oriented x3: Yes   Mood/Affect: Normal         Dilation     Both eyes: 1.0% Mydriacyl, 2.5% Phenylephrine @ 10:10 AM           Slit Lamp and Fundus Exam     External Exam       Right Left   External Normal Normal         Slit Lamp Exam       Right Left   Lids/Lashes Normal Normal   Conjunctiva/Sclera White and quiet White and quiet   Cornea Clear Clear   Anterior Chamber Deep and quiet Deep and quiet   Iris Round and reactive Round and reactive   Lens Posterior chamber intraocular lens, 1+ Posterior capsular opacification not in the visual axis Posterior chamber intraocular lens   Anterior Vitreous Normal Normal         Fundus Exam       Right Left   Posterior Vitreous Posterior vitreous detachment Posterior vitreous detachment   Disc Normal Normal   C/D Ratio 0.35 0.35   Macula Normal Normal, no signs of topographic distortion   Vessels Normal Normal   Periphery Normal,, no new breaks, good retinopexy nasally and inferiorly OD Normal            IMAGING AND PROCEDURES  Imaging and Procedures for 04/06/22  OCT, Retina - OU - Both Eyes       Right Eye Quality was good. Scan locations included subfoveal. Central Foveal Thickness: 283.   Left Eye Quality was good. Scan locations included subfoveal. Central Foveal Thickness: 282. Findings include epiretinal membrane.   Notes OD with posterior vitreous detachment, no active maculopathy  OS with nondistorting epiretinal membrane roll to the fovea, observe             ASSESSMENT/PLAN:  Retinal break of right eye No new retinal breaks  Posterior vitreous detachment of right eye Stable over time  Macular pucker, left eye Minor no topographic distortion  Right  posterior capsular opacification Not likely visually significant at this time     ICD-10-CM   1. Posterior vitreous detachment of right eye  H43.811 OCT, Retina - OU - Both Eyes    2.  Retinal break of right eye  H33.301     3. Macular pucker, left eye  H35.372     4. Right posterior capsular opacification  H26.491       1.  OD, history of retinal tear, no new findings.  Retina periphery normal OU.  2.  PC opacification of right eye not likely significant  3.  ERM OS temporally, appears to have somewhat resolved but no impact on acuity at this time  Ophthalmic Meds Ordered this visit:  No orders of the defined types were placed in this encounter.      Return in about 2 years (around 04/06/2024) for Follow-up Dr. Rojelio Brenner or Dr. Katy Fitch as scheduled, DILATE OU, COLOR FP.  There are no Patient Instructions on file for this visit.   Explained the diagnoses, plan, and follow up with the patient and they expressed understanding.  Patient expressed understanding of the importance of proper follow up care.   Clent Demark Ja Ohman M.D. Diseases & Surgery of the Retina and Vitreous [facility]ina [facility] Diabetic [place] 04/06/22     Abbreviations: M myopia (nearsighted); A astigmatism; H hyperopia (farsighted); P presbyopia; Mrx spectacle prescription;  CTL contact lenses; OD right eye; OS left eye; OU both eyes  XT exotropia; ET esotropia; PEK punctate epithelial keratitis; PEE punctate epithelial erosions; DES dry eye syndrome; MGD meibomian gland dysfunction; ATs artificial tears; PFAT's preservative free artificial tears; [place] nuclear sclerotic cataract; PSC posterior subcapsular cataract; ERM epi-retinal membrane; PVD posterior vitreous detachment; RD retinal detachment; DM diabetes mellitus; DR diabetic retinopathy; NPDR non-proliferative diabetic retinopathy; PDR proliferative diabetic retinopathy; CSME clinically significant macular edema; DME diabetic macular edema; dbh dot blot hemorrhages; CWS  cotton wool spot; POAG primary open angle glaucoma; C/D cup-to-disc ratio; HVF humphrey visual field; GVF goldmann visual field; OCT optical coherence tomography; IOP intraocular pressure; BRVO Branch retinal vein occlusion; CRVO central retinal vein occlusion; CRAO central retinal artery occlusion; BRAO branch retinal artery occlusion; RT retinal tear; SB scleral buckle; PPV pars plana vitrectomy; VH Vitreous hemorrhage; PRP panretinal laser photocoagulation; IVK intravitreal kenalog; VMT vitreomacular traction; MH Macular hole;  NVD neovascularization of the disc; NVE neovascularization elsewhere; AREDS age related eye disease study; ARMD age related macular degeneration; POAG primary open angle glaucoma; EBMD epithelial/anterior basement membrane dystrophy; ACIOL anterior chamber intraocular lens; IOL intraocular lens; PCIOL posterior chamber intraocular lens; Phaco/IOL phacoemulsification with intraocular lens placement; [place] photorefractive keratectomy; LASIK laser assisted in situ keratomileusis; HTN hypertension; DM diabetes mellitus; COPD chronic obstructive pulmonary disease

## 2022-04-06 NOTE — Assessment & Plan Note (Signed)
Not likely visually significant at this time

## 2022-04-06 NOTE — Assessment & Plan Note (Signed)
Minor no topographic distortion

## 2022-04-28 ENCOUNTER — Other Ambulatory Visit: Payer: Self-pay | Admitting: Internal Medicine

## 2022-04-28 DIAGNOSIS — I10 Essential (primary) hypertension: Secondary | ICD-10-CM

## 2022-05-05 ENCOUNTER — Other Ambulatory Visit: Payer: Self-pay | Admitting: Internal Medicine

## 2022-05-05 DIAGNOSIS — I10 Essential (primary) hypertension: Secondary | ICD-10-CM

## 2022-05-07 ENCOUNTER — Encounter: Payer: Medicare Other | Admitting: Internal Medicine

## 2022-05-27 ENCOUNTER — Other Ambulatory Visit: Payer: Self-pay | Admitting: Internal Medicine

## 2022-05-27 DIAGNOSIS — I10 Essential (primary) hypertension: Secondary | ICD-10-CM

## 2022-07-29 ENCOUNTER — Other Ambulatory Visit: Payer: Self-pay | Admitting: Internal Medicine

## 2022-07-29 DIAGNOSIS — I10 Essential (primary) hypertension: Secondary | ICD-10-CM

## 2022-08-03 DIAGNOSIS — C642 Malignant neoplasm of left kidney, except renal pelvis: Secondary | ICD-10-CM | POA: Diagnosis not present

## 2022-08-06 ENCOUNTER — Ambulatory Visit (HOSPITAL_COMMUNITY)
Admission: RE | Admit: 2022-08-06 | Discharge: 2022-08-06 | Disposition: A | Discharge status: Home / Self Care | Payer: Medicare Other | Source: Ambulatory Visit | Attending: Urology | Admitting: Urology

## 2022-08-06 ENCOUNTER — Other Ambulatory Visit (HOSPITAL_COMMUNITY): Payer: Self-pay | Admitting: Urology

## 2022-08-06 DIAGNOSIS — F17210 Nicotine dependence, cigarettes, uncomplicated: Secondary | ICD-10-CM | POA: Diagnosis not present

## 2022-08-06 DIAGNOSIS — C642 Malignant neoplasm of left kidney, except renal pelvis: Secondary | ICD-10-CM

## 2022-08-06 DIAGNOSIS — K8020 Calculus of gallbladder without cholecystitis without obstruction: Secondary | ICD-10-CM | POA: Diagnosis not present

## 2022-08-17 DIAGNOSIS — C642 Malignant neoplasm of left kidney, except renal pelvis: Secondary | ICD-10-CM | POA: Diagnosis not present

## 2022-09-17 ENCOUNTER — Ambulatory Visit: Payer: Medicare Other | Admitting: Internal Medicine

## 2022-10-10 ENCOUNTER — Other Ambulatory Visit: Payer: Self-pay | Admitting: Internal Medicine

## 2022-10-10 DIAGNOSIS — I10 Essential (primary) hypertension: Secondary | ICD-10-CM

## 2023-03-05 ENCOUNTER — Other Ambulatory Visit: Payer: Self-pay | Admitting: Internal Medicine

## 2023-03-05 DIAGNOSIS — I10 Essential (primary) hypertension: Secondary | ICD-10-CM

## 2023-03-15 ENCOUNTER — Other Ambulatory Visit: Payer: Self-pay | Admitting: Internal Medicine

## 2023-03-15 DIAGNOSIS — I10 Essential (primary) hypertension: Secondary | ICD-10-CM

## 2023-03-27 ENCOUNTER — Other Ambulatory Visit: Payer: Self-pay | Admitting: Internal Medicine

## 2023-03-27 DIAGNOSIS — I10 Essential (primary) hypertension: Secondary | ICD-10-CM

## 2023-04-07 ENCOUNTER — Encounter (INDEPENDENT_AMBULATORY_CARE_PROVIDER_SITE_OTHER): Payer: Medicare Other | Admitting: Ophthalmology

## 2023-05-24 ENCOUNTER — Other Ambulatory Visit: Payer: Self-pay | Admitting: Internal Medicine

## 2023-05-24 DIAGNOSIS — I10 Essential (primary) hypertension: Secondary | ICD-10-CM

## 2023-06-05 ENCOUNTER — Other Ambulatory Visit: Payer: Self-pay | Admitting: Internal Medicine

## 2023-06-05 DIAGNOSIS — I10 Essential (primary) hypertension: Secondary | ICD-10-CM

## 2023-08-05 ENCOUNTER — Other Ambulatory Visit: Payer: Self-pay | Admitting: Internal Medicine

## 2023-08-05 DIAGNOSIS — I10 Essential (primary) hypertension: Secondary | ICD-10-CM

## 2023-08-14 ENCOUNTER — Other Ambulatory Visit: Payer: Self-pay | Admitting: Internal Medicine

## 2023-08-14 DIAGNOSIS — I10 Essential (primary) hypertension: Secondary | ICD-10-CM

## 2023-08-20 ENCOUNTER — Ambulatory Visit (HOSPITAL_COMMUNITY)
Admission: RE | Admit: 2023-08-20 | Discharge: 2023-08-20 | Disposition: A | Discharge status: Home / Self Care | Payer: Medicare Other | Source: Ambulatory Visit | Attending: Urology

## 2023-08-20 ENCOUNTER — Other Ambulatory Visit (HOSPITAL_COMMUNITY): Payer: Self-pay | Admitting: Urology

## 2023-08-20 DIAGNOSIS — C642 Malignant neoplasm of left kidney, except renal pelvis: Secondary | ICD-10-CM

## 2023-08-20 DIAGNOSIS — R918 Other nonspecific abnormal finding of lung field: Secondary | ICD-10-CM | POA: Diagnosis not present

## 2023-08-20 DIAGNOSIS — C649 Malignant neoplasm of unspecified kidney, except renal pelvis: Secondary | ICD-10-CM | POA: Diagnosis not present

## 2023-08-31 DIAGNOSIS — C642 Malignant neoplasm of left kidney, except renal pelvis: Secondary | ICD-10-CM | POA: Diagnosis not present

## 2023-08-31 DIAGNOSIS — I700 Atherosclerosis of aorta: Secondary | ICD-10-CM | POA: Diagnosis not present

## 2023-08-31 DIAGNOSIS — R918 Other nonspecific abnormal finding of lung field: Secondary | ICD-10-CM | POA: Diagnosis not present

## 2023-08-31 DIAGNOSIS — K429 Umbilical hernia without obstruction or gangrene: Secondary | ICD-10-CM | POA: Diagnosis not present

## 2023-08-31 DIAGNOSIS — K8020 Calculus of gallbladder without cholecystitis without obstruction: Secondary | ICD-10-CM | POA: Diagnosis not present

## 2023-08-31 DIAGNOSIS — Z905 Acquired absence of kidney: Secondary | ICD-10-CM | POA: Diagnosis not present

## 2023-08-31 DIAGNOSIS — K5710 Diverticulosis of small intestine without perforation or abscess without bleeding: Secondary | ICD-10-CM | POA: Diagnosis not present

## 2023-09-07 DIAGNOSIS — K8020 Calculus of gallbladder without cholecystitis without obstruction: Secondary | ICD-10-CM | POA: Diagnosis not present

## 2023-09-07 DIAGNOSIS — K429 Umbilical hernia without obstruction or gangrene: Secondary | ICD-10-CM | POA: Diagnosis not present

## 2023-09-07 DIAGNOSIS — C642 Malignant neoplasm of left kidney, except renal pelvis: Secondary | ICD-10-CM | POA: Diagnosis not present

## 2023-09-07 DIAGNOSIS — I700 Atherosclerosis of aorta: Secondary | ICD-10-CM | POA: Diagnosis not present

## 2023-09-07 DIAGNOSIS — R918 Other nonspecific abnormal finding of lung field: Secondary | ICD-10-CM | POA: Diagnosis not present

## 2023-09-14 DIAGNOSIS — C642 Malignant neoplasm of left kidney, except renal pelvis: Secondary | ICD-10-CM | POA: Diagnosis not present

## 2023-11-11 DIAGNOSIS — Z961 Presence of intraocular lens: Secondary | ICD-10-CM | POA: Diagnosis not present

## 2023-11-11 DIAGNOSIS — H43812 Vitreous degeneration, left eye: Secondary | ICD-10-CM | POA: Diagnosis not present

## 2023-11-11 DIAGNOSIS — H26491 Other secondary cataract, right eye: Secondary | ICD-10-CM | POA: Diagnosis not present

## 2023-12-15 ENCOUNTER — Ambulatory Visit

## 2023-12-16 ENCOUNTER — Encounter: Payer: Self-pay | Admitting: Internal Medicine

## 2023-12-16 ENCOUNTER — Ambulatory Visit (INDEPENDENT_AMBULATORY_CARE_PROVIDER_SITE_OTHER): Admitting: Internal Medicine

## 2023-12-16 VITALS — BP 146/98 | HR 88 | Temp 97.8°F | Wt 245.0 lb

## 2023-12-16 DIAGNOSIS — M545 Low back pain, unspecified: Secondary | ICD-10-CM

## 2023-12-16 MED ORDER — CYCLOBENZAPRINE HCL 5 MG PO TABS: 5.0000 mg | ORAL_TABLET | Freq: Every evening | ORAL | 1 refills | Status: DC | PRN

## 2023-12-16 MED ORDER — MELOXICAM 7.5 MG PO TABS: 7.5000 mg | ORAL_TABLET | Freq: Every day | ORAL | 0 refills | Status: DC

## 2023-12-16 NOTE — Progress Notes (Signed)
 Established Patient Office Visit     CC/Reason for Visit: Low back pain  HPI: Thomas Baldwin is a 68 y.o. male who is coming in today for the above mentioned reasons.  This has been occurring for about the past 6 months.  His wife unfortunately has been diagnosed with a terminal illness and he has been doing a lot of caregiving for her.  He does not have radiculopathy bowel or bladder incontinence or saddle anesthesia.   Past Medical/Surgical History: Past Medical History:  Diagnosis Date   Atrial fibrillation ([facility])    Atypical chest pain    GERD (gastroesophageal reflux disease)    Questionable   Hematuria none since 07-22-2019   Hypertension    Nerve damage    left hand    Past Surgical History:  Procedure Laterality Date   colonscopy  2010   CYSTOSCOPY WITH RETROGRADE PYELOGRAM, URETEROSCOPY AND STENT PLACEMENT Bilateral 08/04/2019   Procedure: CYSTOSCOPY WITH BILATERAL  RETROGRADE PYELOGRAM, LEFT DIAGNOSTIC URETEROSCOPY  AND POSSIBLE BIOPSY AND STENT PLACEMENT;  Surgeon: Andrez Banker, MD;  [facility];  Service: Urology;  Laterality: Bilateral;   LAPAROSCOPIC NEPHRECTOMY, HAND ASSISTED Left 08/24/2019   Procedure: LAPAROSCOPIC LEFT NEPHRECTOMY;  Surgeon: Andrez Banker, MD;  Location: [facility];  Service: Urology;  Laterality: Left;   NOSE SURGERY  1980   TONSILECTOMY, ADENOIDECTOMY, BILATERAL MYRINGOTOMY AND TUBES      Social History:  reports that he has been smoking cigars. He has never used smokeless tobacco. He reports current alcohol use of about 21.0 standard drinks of alcohol per week. He reports that he does not currently use drugs.  Allergies: No Known Allergies  Family History:  Family History  Problem Relation Age of Onset   Coronary artery disease Other    Diabetes Other    Hypertension Other    Colon cancer Neg Hx      Current Outpatient Medications:    acetaminophen  (TYLENOL ) 325 MG tablet, Take 975 mg by mouth  every 6 (six) hours as needed for moderate pain or headache., Disp: , Rfl:    amLODipine  (NORVASC ) 5 MG tablet, TAKE 1 TABLET BY MOUTH DAILY, Disp: 100 tablet, Rfl: 0   cyclobenzaprine (FLEXERIL) 5 MG tablet, Take 1 tablet (5 mg total) by mouth at bedtime as needed for muscle spasms., Disp: 30 tablet, Rfl: 1   losartan  (COZAAR ) 100 MG tablet, TAKE 1 TABLET BY MOUTH DAILY, Disp: 100 tablet, Rfl: 0   meloxicam (MOBIC) 7.5 MG tablet, Take 1 tablet (7.5 mg total) by mouth daily., Disp: 10 tablet, Rfl: 0   metoprolol  tartrate (LOPRESSOR ) 50 MG tablet, TAKE 1 TABLET BY MOUTH TWICE  DAILY, Disp: 200 tablet, Rfl: 0   Multiple Vitamins-Minerals (MULTIVITAMIN ADULT PO), Take by mouth daily., Disp: , Rfl:    omeprazole  (PRILOSEC) 20 MG capsule, TAKE 1 CAPSULE BY MOUTH DAILY, Disp: 100 capsule, Rfl: 0   traZODone  (DESYREL ) 150 MG tablet, TAKE 1 TABLET BY MOUTH AT  BEDTIME, Disp: 90 tablet, Rfl: 3  Review of Systems:  Negative unless indicated in HPI.   Physical Exam: Vitals:   12/16/23 1319 12/16/23 1323  BP: (!) 130/100 (!) 146/98  Pulse: 88   Temp: 97.8 F (36.6 C)   TempSrc: Oral   SpO2: 98%   Weight: 245 lb (111.1 kg)     Body mass index is 28.68 kg/m.    Impression and Plan:  Acute bilateral low back pain without sciatica -  Meloxicam; Take 1 tablet (7.5 mg total) by mouth daily.  Dispense: 10 tablet; Refill: 0 -     Cyclobenzaprine HCl; Take 1 tablet (5 mg total) by mouth at bedtime as needed for muscle spasms.  Dispense: 30 tablet; Refill: 1 -     Ambulatory referral to Physical Therapy   - Suspect this to be musculoskeletal given lack of red flag symptoms.  Will refer to physical therapy, give meloxicam and Flexeril.  Given his solitary kidney only 10 days of meloxicam advised.  Time spent:30 minutes reviewing chart, interviewing and examining patient and formulating plan of care.     Marguerita Shih, MD [facility] at [facility]

## 2023-12-17 ENCOUNTER — Ambulatory Visit

## 2023-12-17 ENCOUNTER — Encounter: Payer: Self-pay | Admitting: Internal Medicine

## 2023-12-17 VITALS — BP 120/60 | HR 60 | Temp 98.0°F | Ht 78.5 in | Wt 243.0 lb

## 2023-12-17 DIAGNOSIS — Z0000 Encounter for general adult medical examination without abnormal findings: Secondary | ICD-10-CM

## 2023-12-17 DIAGNOSIS — Z1211 Encounter for screening for malignant neoplasm of colon: Secondary | ICD-10-CM

## 2023-12-17 NOTE — Progress Notes (Signed)
 Subjective:   Thomas Baldwin is a 68 y.o. who presents for a Medicare Wellness preventive visit.  As a reminder, Annual Wellness Visits don't include a physical exam, and some assessments may be limited, especially if this visit is performed virtually. We may recommend an in-person follow-up visit with your provider if needed.  Visit Complete: In person    Persons Participating in Visit: Patient.  AWV Questionnaire: No: Patient Medicare AWV questionnaire was not completed prior to this visit.  Cardiac Risk Factors include: advanced age (>65men, >76 women);male gender;hypertension     Objective:     Today's Vitals   12/17/23 0804  BP: 120/60  Pulse: 60  Temp: 98 F (36.7 C)  TempSrc: Oral  SpO2: 93%  Weight: 243 lb (110.2 kg)  Height: 6' 6.5" (1.994 m)   Body mass index is 27.72 kg/m.     12/17/2023    8:20 AM 08/24/2019    1:00 PM 08/04/2019    6:01 AM  Advanced Directives  Does Patient Have a Medical Advance Directive? Yes Yes Yes  Type of Estate agent of [place];Living will Living will Living will  Does patient want to make changes to medical advance directive?  No - Patient declined No - Patient declined  Copy of Healthcare Power of Attorney in Chart? No - copy requested      Current Medications (verified) Outpatient Encounter Medications as of 12/17/2023  Medication Sig   acetaminophen  (TYLENOL ) 325 MG tablet Take 975 mg by mouth every 6 (six) hours as needed for moderate pain or headache.   amLODipine  (NORVASC ) 5 MG tablet TAKE 1 TABLET BY MOUTH DAILY   cyclobenzaprine (FLEXERIL) 5 MG tablet Take 1 tablet (5 mg total) by mouth at bedtime as needed for muscle spasms.   losartan  (COZAAR ) 100 MG tablet TAKE 1 TABLET BY MOUTH DAILY   meloxicam (MOBIC) 7.5 MG tablet Take 1 tablet (7.5 mg total) by mouth daily.   metoprolol  tartrate (LOPRESSOR ) 50 MG tablet TAKE 1 TABLET BY MOUTH TWICE  DAILY   Multiple Vitamins-Minerals (MULTIVITAMIN ADULT PO)  Take by mouth daily.   omeprazole  (PRILOSEC) 20 MG capsule TAKE 1 CAPSULE BY MOUTH DAILY   traZODone  (DESYREL ) 150 MG tablet TAKE 1 TABLET BY MOUTH AT  BEDTIME   No facility-administered encounter medications on file as of 12/17/2023.    Allergies (verified) Patient has no known allergies.   History: Past Medical History:  Diagnosis Date   Atrial fibrillation (HCC)    Atypical chest pain    GERD (gastroesophageal reflux disease)    Questionable   Hematuria none since 07-22-2019   Hypertension    Nerve damage    left hand   Past Surgical History:  Procedure Laterality Date   colonscopy  2010   CYSTOSCOPY WITH RETROGRADE PYELOGRAM, URETEROSCOPY AND STENT PLACEMENT Bilateral 08/04/2019   Procedure: CYSTOSCOPY WITH BILATERAL  RETROGRADE PYELOGRAM, LEFT DIAGNOSTIC URETEROSCOPY  AND POSSIBLE BIOPSY AND STENT PLACEMENT;  Surgeon: Andrez Banker, MD;  Location: [facility];  Service: Urology;  Laterality: Bilateral;   LAPAROSCOPIC NEPHRECTOMY, HAND ASSISTED Left 08/24/2019   Procedure: LAPAROSCOPIC LEFT NEPHRECTOMY;  Surgeon: Andrez Banker, MD;  Location: [facility];  Service: Urology;  Laterality: Left;   NOSE SURGERY  1980   TONSILECTOMY, ADENOIDECTOMY, BILATERAL MYRINGOTOMY AND TUBES     Family History  Problem Relation Age of Onset   Coronary artery disease Other    Diabetes Other    Hypertension Other  Colon cancer Neg Hx    Social History   Socioeconomic History   Marital status: Married    Spouse name: Not on file   Number of children: Not on file   Years of education: Not on file   Highest education level: Not on file  Occupational History   Not on file  Tobacco Use   Smoking status: Some Days    Types: Cigars   Smokeless tobacco: Never   Tobacco comments:    occ  Vaping Use   Vaping status: Never Used  Substance and Sexual Activity   Alcohol use: Yes    Alcohol/week: 21.0 standard drinks of alcohol    Types: 21 Cans of beer per week     Comment: approx 3 beers/day   Drug use: Not Currently   Sexual activity: Not on file  Other Topics Concern   Not on file  Social History Narrative   Not on file   Social Drivers of Health   Financial Resource Strain: Low Risk  (12/17/2023)   Overall Financial Resource Strain (CARDIA)    Difficulty of Paying Living Expenses: Not hard at all  Food Insecurity: No Food Insecurity (12/17/2023)   Hunger Vital Sign    Worried About Running Out of Food in the Last Year: Never true    Ran Out of Food in the Last Year: Never true  Transportation Needs: No Transportation Needs (12/17/2023)   PRAPARE - Administrator, Civil Service (Medical): No    Lack of Transportation (Non-Medical): No  Physical Activity: Insufficiently Active (12/17/2023)   Exercise Vital Sign    Days of Exercise per Week: 3 days    Minutes of Exercise per Session: 20 min  Stress: Stress Concern Present (12/17/2023)   [employer] of Occupational Health - Occupational Stress Questionnaire    Feeling of Stress : To some extent  Social Connections: Socially Integrated (12/17/2023)   Social Connection and Isolation Panel [NHANES]    Frequency of Communication with Friends and Family: More than three times a week    Frequency of Social Gatherings with Friends and Family: More than three times a week    Attends Religious Services: More than 4 times per year    Active Member of [employer] or Organizations: Yes    Attends Engineer, structural: More than 4 times per year    Marital Status: Married    Tobacco Counseling Ready to quit: No Counseling given: Yes Tobacco comments: occ    Clinical Intake:  Pre-visit preparation completed: Yes  Pain : No/denies pain     BMI - recorded: 27.72 Nutritional Status: BMI 25 -29 Overweight Nutritional Risks: None Diabetes: No  Lab Results  Component Value Date   HGBA1C 5.6 03/19/2022   HGBA1C 5.2 10/16/2020   HGBA1C 5.3 04/17/2020     How often do you need  to have someone help you when you read instructions, pamphlets, or other written materials from your doctor or pharmacy?: 1 - Never  Interpreter Needed?: No  Information entered by :: Farris Hong LPN   Activities of Daily Living     12/17/2023    8:19 AM  In your present state of health, do you have any difficulty performing the following activities:  Hearing? 0  Vision? 0  Difficulty concentrating or making decisions? 0  Walking or climbing stairs? 0  Dressing or bathing? 0  Doing errands, shopping? 0  Preparing Food and eating ? N  Using the Toilet? N  In the past six months, have you accidently leaked urine? N  Do you have problems with loss of bowel control? N  Managing your Medications? N  Managing your Finances? N  Housekeeping or managing your Housekeeping? N    Patient Care Team: Zilphia Hilt, Charyl Coppersmith, MD as PCP - General (Internal Medicine) Riley Cheadle Windsor Hatcher, MD as Consulting Physician (Gastroenterology)  I have updated your Care Teams any recent Medical Services you may have received from other providers in the past year.     Assessment:    This is a routine wellness examination for [facility].  Hearing/Vision screen Hearing Screening - Comments:: Denies hearing difficulties   Vision Screening - Comments:: Wears rx glasses - up to date with routine eye exams with  Dr Candi Chafe   Goals Addressed               This Visit's Progress     Increase physical activity (pt-stated)        Remain Active. Lose weight.       Depression Screen     12/17/2023    8:17 AM 12/17/2023    8:04 AM 12/16/2023    1:46 PM 03/19/2022    1:02 PM 04/17/2020   10:02 AM 02/21/2020    3:34 PM 09/01/2018    8:49 AM  PHQ 2/9 Scores  PHQ - 2 Score 0 0 2 3 1  0 2  PHQ- 9 Score 0 0 9 7 2  0 5    Fall Risk     12/17/2023    8:19 AM 12/16/2023    1:46 PM 03/19/2022    1:02 PM 04/17/2020   10:02 AM 02/21/2020    3:33 PM  Fall Risk   Falls in the past year? 0 0 0 0 0  Number falls in past  yr: 0 0 0 0 0  Injury with Fall? 0 0 0 0 0  Risk for fall due to : No Fall Risks  No Fall Risks    Follow up Falls evaluation completed Falls evaluation completed Falls evaluation completed      MEDICARE RISK AT HOME:  Medicare Risk at Home Any stairs in or around the home?: No If so, are there any without handrails?: No Home free of loose throw rugs in walkways, pet beds, electrical cords, etc?: Yes Adequate lighting in your home to reduce risk of falls?: Yes Life alert?: No Use of a cane, walker or w/c?: No Grab bars in the bathroom?: Yes Shower chair or bench in shower?: Yes Elevated toilet seat or a handicapped toilet?: No  TIMED UP AND GO:  Was the test performed?  Yes  Length of time to ambulate 10 feet: 10   sec Gait steady and fast without use of assistive device  Cognitive Function: 6CIT completed        12/17/2023    8:20 AM  6CIT Screen  What Year? 0 points  What month? 0 points  What time? 0 points  Count back from 20 0 points  Months in reverse 0 points  Repeat phrase 0 points  Total Score 0 points    Immunizations Immunization History  Administered Date(s) Administered   Fluad Quad(high Dose 65+) 03/19/2022   Influenza Inj Mdck Quad Pf 04/28/2018   Influenza,inj,Quad PF,6+ Mos 04/17/2020   PFIZER(Purple Top)SARS-COV-2 Vaccination 07/14/2019, 08/14/2019, 03/27/2020   Tdap 12/07/2014   Zoster Recombinant(Shingrix ) 09/01/2018, 11/30/2018    Screening Tests Health Maintenance  Topic Date Due   Pneumonia Vaccine 65+ Years  old (1 of 2 - PCV) Never done   Colonoscopy  Never done   COVID-19 Vaccine (4 - 2024-25 season) 03/14/2023   INFLUENZA VACCINE  02/11/2024   DTaP/Tdap/Td (2 - Td or Tdap) 12/06/2024   Medicare Annual Wellness (AWV)  12/16/2024   Hepatitis C Screening  Completed   Zoster Vaccines- Shingrix   Completed   HPV VACCINES  Aged Out   Meningococcal B Vaccine  Aged Out    Health Maintenance  Health Maintenance Due  Topic Date Due    Pneumonia Vaccine 10+ Years old (1 of 2 - PCV) Never done   Colonoscopy  Never done   COVID-19 Vaccine (4 - 2024-25 season) 03/14/2023   Health Maintenance Items Addressed: Patient deferred Colonoscopy  Additional Screening:  Vision Screening: Recommended annual ophthalmology exams for early detection of glaucoma and other disorders of the eye. Would you like a referral to an eye doctor? No    Dental Screening: Recommended annual dental exams for proper oral hygiene  Community Resource Referral / Chronic Care Management: CRR required this visit?  No   CCM required this visit?  No   Plan:    I have personally reviewed and noted the following in the patient's chart:   Medical and social history Use of alcohol, tobacco or illicit drugs  Current medications and supplements including opioid prescriptions. Patient is not currently taking opioid prescriptions. Functional ability and status Nutritional status Physical activity Advanced directives List of other physicians Hospitalizations, surgeries, and ER visits in previous 12 months Vitals Screenings to include cognitive, depression, and falls Referrals and appointments  In addition, I have reviewed and discussed with patient certain preventive protocols, quality metrics, and best practice recommendations. A written personalized care plan for preventive services as well as general preventive health recommendations were provided to patient.   Dewayne Ford, LPN   11/14/979   After Visit Summary: (In Person-Printed) AVS printed and given to the patient  Notes: Nothing significant to report at this time.

## 2023-12-17 NOTE — Patient Instructions (Signed)
 Thomas Baldwin , Thank you for taking time out of your busy schedule to complete your Annual Wellness Visit with me. I enjoyed our conversation and look forward to speaking with you again next year. I, as well as your care team,  appreciate your ongoing commitment to your health goals. Please review the following plan we discussed and let me know if I can assist you in the future. Your Game plan/ To Do List    Referrals: If you haven't heard from the office you've been referred to, please reach out to them at the phone provided.   Follow up Visits: Next Medicare AWV with our clinical staff: 12/22/24 @ 8a   Have you seen your provider in the last 6 months (3 months if uncontrolled diabetes)? Yes 12/16/23 Next Office Visit with your provider:   Clinician Recommendations:  Aim for 30 minutes of exercise or brisk walking, 6-8 glasses of water, and 5 servings of fruits and vegetables each day.       This is a list of the screening recommended for you and due dates:  Health Maintenance  Topic Date Due   Pneumonia Vaccine (1 of 2 - PCV) Never done   Colon Cancer Screening  Never done   COVID-19 Vaccine (4 - 2024-25 season) 03/14/2023   Flu Shot  02/11/2024   DTaP/Tdap/Td vaccine (2 - Td or Tdap) 12/06/2024   Medicare Annual Wellness Visit  12/16/2024   Hepatitis C Screening  Completed   Zoster (Shingles) Vaccine  Completed   HPV Vaccine  Aged Out   Meningitis B Vaccine  Aged Out    Advanced directives: (Copy Requested) Please bring a copy of your health care power of attorney and living will to the office to be added to your chart at your convenience. You can mail to [facility] [address] [place], [place] [address] or email to [email or web address] Advance Care Planning is important because it:  [x]  Makes sure you receive the medical care that is consistent with your values, goals, and preferences  [x]  It provides guidance to your family and loved ones and reduces their  decisional burden about whether or not they are making the right decisions based on your wishes.  Follow the link provided in your after visit summary or read over the paperwork we have mailed to you to help you started getting your Advance Directives in place. If you need assistance in completing these, please reach out to us  so that we can help you!  See attachments for Preventive Care and Fall Prevention Tips.

## 2024-01-05 ENCOUNTER — Encounter (INDEPENDENT_AMBULATORY_CARE_PROVIDER_SITE_OTHER): Payer: Self-pay | Admitting: *Deleted

## 2024-01-12 ENCOUNTER — Other Ambulatory Visit: Payer: Self-pay | Admitting: Internal Medicine

## 2024-01-12 DIAGNOSIS — I10 Essential (primary) hypertension: Secondary | ICD-10-CM

## 2024-01-18 ENCOUNTER — Ambulatory Visit (HOSPITAL_COMMUNITY): Attending: Internal Medicine

## 2024-01-18 ENCOUNTER — Encounter (HOSPITAL_COMMUNITY): Payer: Self-pay

## 2024-01-18 ENCOUNTER — Other Ambulatory Visit: Payer: Self-pay

## 2024-01-18 DIAGNOSIS — M545 Low back pain, unspecified: Secondary | ICD-10-CM | POA: Insufficient documentation

## 2024-01-18 NOTE — Therapy (Addendum)
 [facility] Outpatient Rehabilitation at [facility] [address] [place], [place], [address] Phone: [phone]   Fax:  [phone]  Patient Details  Name: Thomas Baldwin MRN: [id number] Date of Birth: [birth year] Referring Provider:  Theophilus Andrews, Est[place]*  Encounter Date: 01/18/2024  Pt arriving today with back pain that initiated 6 weeks ago. Pt reporting that his back has since then cleared up and has no acute or skilled PT needs at this time. Time was spent discussing Physical Therapy needs and ways to address further deficits if indicated. Pt was educated to to receive another PT referral if experiences back again.   Omega JONETTA Bottcher PT, DPT [facility]- [place] [address] [phone] office  Omega JONETTA Bottcher, PT 01/18/2024, 1:50 PM  [facility] Outpatient Rehabilitation at [facility] [address] [place], [place], [address] Phone: [phone]   Fax:  [phone]

## 2024-01-31 ENCOUNTER — Ambulatory Visit

## 2024-01-31 ENCOUNTER — Ambulatory Visit: Payer: Self-pay

## 2024-01-31 VITALS — BP 132/76 | HR 61 | Ht 78.0 in | Wt 236.0 lb

## 2024-01-31 DIAGNOSIS — I10 Essential (primary) hypertension: Secondary | ICD-10-CM | POA: Diagnosis not present

## 2024-01-31 DIAGNOSIS — I480 Paroxysmal atrial fibrillation: Secondary | ICD-10-CM | POA: Diagnosis not present

## 2024-01-31 NOTE — Progress Notes (Signed)
 New Patient Office Visit  Subjective    Patient ID: Thomas Baldwin, male    DOB: [birth year]  Age: 68 y.o. MRN: [id number]  CC:  Chief Complaint  Patient presents with   Transitions Of Care    Pt here for [facility]   HPI Thomas Baldwin presents to establish care.  He was previously a patient of Dr. Coralie at [facility] location.  He was last seen in their office on 12/16/2023 for acute back pain.  He denies any concerns or complaints at this time. Outpatient Encounter Medications as of 01/31/2024  Medication Sig   amLODipine  (NORVASC ) 5 MG tablet TAKE 1 TABLET BY MOUTH DAILY   losartan  (COZAAR ) 100 MG tablet TAKE 1 TABLET BY MOUTH DAILY   metoprolol  tartrate (LOPRESSOR ) 50 MG tablet TAKE 1 TABLET BY MOUTH TWICE  DAILY   omeprazole  (PRILOSEC) 20 MG capsule TAKE 1 CAPSULE BY MOUTH DAILY   traZODone  (DESYREL ) 150 MG tablet TAKE 1 TABLET BY MOUTH AT  BEDTIME   [DISCONTINUED] acetaminophen  (TYLENOL ) 325 MG tablet Take 975 mg by mouth every 6 (six) hours as needed for moderate pain or headache.   [DISCONTINUED] cyclobenzaprine  (FLEXERIL ) 5 MG tablet Take 1 tablet (5 mg total) by mouth at bedtime as needed for muscle spasms.   [DISCONTINUED] meloxicam  (MOBIC ) 7.5 MG tablet Take 1 tablet (7.5 mg total) by mouth daily.   [DISCONTINUED] Multiple Vitamins-Minerals (MULTIVITAMIN ADULT PO) Take by mouth daily.   No facility-administered encounter medications on file as of 01/31/2024.    Past Medical History:  Diagnosis Date   Atrial fibrillation (HCC)    Atypical chest pain    GERD (gastroesophageal reflux disease)    Questionable   Hematuria none since 07-22-2019   Hypertension    Nerve damage    left hand    Past Surgical History:  Procedure Laterality Date   colonscopy  2010   CYSTOSCOPY WITH RETROGRADE PYELOGRAM, URETEROSCOPY AND STENT PLACEMENT Bilateral 08/04/2019   Procedure: CYSTOSCOPY WITH BILATERAL  RETROGRADE PYELOGRAM, LEFT DIAGNOSTIC URETEROSCOPY  AND  POSSIBLE BIOPSY AND STENT PLACEMENT;  Surgeon: Cam Morene ORN, MD;  Location: [facility];  Service: Urology;  Laterality: Bilateral;   LAPAROSCOPIC NEPHRECTOMY, HAND ASSISTED Left 08/24/2019   Procedure: LAPAROSCOPIC LEFT NEPHRECTOMY;  Surgeon: Cam Morene ORN, MD;  Location: [facility];  Service: Urology;  Laterality: Left;   NOSE SURGERY  1980   TONSILECTOMY, ADENOIDECTOMY, BILATERAL MYRINGOTOMY AND TUBES      Family History  Problem Relation Age of Onset   Coronary artery disease Other    Diabetes Other    Hypertension Other    Colon cancer Neg Hx     Social History   Socioeconomic History   Marital status: Married    Spouse name: Not on file   Number of children: Not on file   Years of education: Not on file   Highest education level: Associate degree: occupational, Scientist, product/process development, or vocational program  Occupational History   Not on file  Tobacco Use   Smoking status: Some Days    Types: Cigars   Smokeless tobacco: Never   Tobacco comments:    occ  Vaping Use   Vaping status: Never Used  Substance and Sexual Activity   Alcohol use: Yes    Alcohol/week: 21.0 standard drinks of alcohol    Types: 21 Cans of beer per week    Comment: approx 3 beers/day   Drug use: Not Currently   Sexual activity:  Not on file  Other Topics Concern   Not on file  Social History Narrative   Not on file   Social Drivers of Health   Financial Resource Strain: Low Risk  (01/31/2024)   Overall Financial Resource Strain (CARDIA)    Difficulty of Paying Living Expenses: Not hard at all  Food Insecurity: No Food Insecurity (01/31/2024)   Hunger Vital Sign    Worried About Running Out of Food in the Last Year: Never true    Ran Out of Food in the Last Year: Never true  Transportation Needs: No Transportation Needs (01/31/2024)   PRAPARE - Administrator, Civil Service (Medical): No    Lack of Transportation (Non-Medical): No  Physical Activity: Inactive  (01/31/2024)   Exercise Vital Sign    Days of Exercise per Week: 0 days    Minutes of Exercise per Session: Not on file  Stress: Stress Concern Present (01/31/2024)   [employer] of Occupational Health - Occupational Stress Questionnaire    Feeling of Stress: Very much  Social Connections: Moderately Isolated (01/31/2024)   Social Connection and Isolation Panel    Frequency of Communication with Friends and Family: Three times a week    Frequency of Social Gatherings with Friends and Family: Once a week    Attends Religious Services: Never    Database administrator or Organizations: No    Attends Engineer, structural: Not on file    Marital Status: Married  Catering manager Violence: Not At Risk (12/17/2023)   Humiliation, Afraid, Rape, and Kick questionnaire    Fear of Current or Ex-Partner: No    Emotionally Abused: No    Physically Abused: No    Sexually Abused: No    ROS     Objective    BP 132/76   Pulse 61   Ht 6' 6 (1.981 m)   Wt 236 lb 0.6 oz (107.1 kg)   SpO2 95%   BMI 27.28 kg/m   Physical Exam Vitals and nursing note reviewed.  Constitutional:      Appearance: Normal appearance.  HENT:     Head: Normocephalic.  Eyes:     Extraocular Movements: Extraocular movements intact.     Pupils: Pupils are equal, round, and reactive to light.  Cardiovascular:     Rate and Rhythm: Normal rate. Rhythm irregular.  Pulmonary:     Effort: Pulmonary effort is normal.     Breath sounds: Normal breath sounds.  Musculoskeletal:     Cervical back: Normal range of motion and neck supple.  Neurological:     Mental Status: He is alert and oriented to person, place, and time.  Psychiatric:        Mood and Affect: Mood normal.        Thought Content: Thought content normal.         Assessment & Plan:   Problem List Items Addressed This Visit       Cardiovascular and Mediastinum   Essential hypertension - Primary   Blood pressure stable at this time.   No medication changes made and no refills needed at this time.  Advised to request refill through MyChart as these were last filled by previous PCP.      Atrial fibrillation [facility])   He was last seen for this by cardiology in 2013.  Echo at that time showed CHF.  He is not on ACE therapy.  Will refer to cardiology for evaluation  Relevant Orders   Ambulatory referral to Cardiology    Return in about 3 months (around 05/02/2024) for chronic follow-up with PCP.   Leita Longs, FNP

## 2024-02-01 ENCOUNTER — Encounter: Admitting: Physician Assistant

## 2024-02-02 ENCOUNTER — Telehealth: Payer: Self-pay

## 2024-02-02 NOTE — Telephone Encounter (Signed)
   Who is your primary care physician: Thomas Baldwin  Reasons for the colonoscopy: screening  Have you had a colonoscopy before?  Yes 15 yrs ago  Do you have family history of colon cancer? no  Previous colonoscopy with polyps removed? no  Do you have a history colorectal cancer?   no  Are you diabetic? If yes, Type 1 or Type 2?    no  Do you have a prosthetic or mechanical heart valve? no  Do you have a pacemaker/defibrillator?   no  Have you had endocarditis/atrial fibrillation? no  Have you had joint replacement within the last 12 months?  no  Do you tend to be constipated or have to use laxatives? no  Do you have any history of drugs or alchohol?  no  Do you use supplemental oxygen?  no  Have you had a stroke or heart attack within the last 6 months? no  Do you take weight loss medication?  no  no  Do you take any blood-thinning medications such as: (aspirin, warfarin, Plavix, Aggrenox)  no  If yes we need the name, milligram, dosage and who is prescribing doctor  Current Outpatient Medications on File Prior to Visit  Medication Sig Dispense Refill   amLODipine  (NORVASC ) 5 MG tablet TAKE 1 TABLET BY MOUTH DAILY 100 tablet 0   losartan  (COZAAR ) 100 MG tablet TAKE 1 TABLET BY MOUTH DAILY 100 tablet 0   metoprolol  tartrate (LOPRESSOR ) 50 MG tablet TAKE 1 TABLET BY MOUTH TWICE  DAILY 200 tablet 0   omeprazole  (PRILOSEC) 20 MG capsule TAKE 1 CAPSULE BY MOUTH DAILY 100 capsule 0   traZODone  (DESYREL ) 150 MG tablet TAKE 1 TABLET BY MOUTH AT  BEDTIME 90 tablet 3   No current facility-administered medications on file prior to visit.    No Known Allergies   Pharmacy: Mail [facility] or [facility]  Primary Insurance Name: [facility] [id number]  Best number where you can be reached: [phone]

## 2024-02-08 NOTE — Assessment & Plan Note (Signed)
 Blood pressure stable at this time.  No medication changes made and no refills needed at this time.  Advised to request refill through MyChart as these were last filled by previous PCP.

## 2024-02-08 NOTE — Assessment & Plan Note (Signed)
 He was last seen for this by cardiology in 2013.  Echo at that time showed CHF.  He is not on ACE therapy.  Will refer to cardiology for evaluation

## 2024-02-21 DIAGNOSIS — L820 Inflamed seborrheic keratosis: Secondary | ICD-10-CM | POA: Diagnosis not present

## 2024-02-21 DIAGNOSIS — X32XXXA Exposure to sunlight, initial encounter: Secondary | ICD-10-CM | POA: Diagnosis not present

## 2024-02-21 DIAGNOSIS — B078 Other viral warts: Secondary | ICD-10-CM | POA: Diagnosis not present

## 2024-02-21 DIAGNOSIS — L570 Actinic keratosis: Secondary | ICD-10-CM | POA: Diagnosis not present

## 2024-02-21 DIAGNOSIS — D225 Melanocytic nevi of trunk: Secondary | ICD-10-CM | POA: Diagnosis not present

## 2024-03-10 NOTE — Telephone Encounter (Signed)
LMOVM to call back to make aware.

## 2024-03-10 NOTE — Telephone Encounter (Signed)
 We will need to hold off on scheduling colonoscopy. Patient has history of paroxysmal a fib, prior ECHO in 2013 with mildly reduced EF, and PCP has recently referred him back to cardiology as he hasn't been see since 2013. He should see cardiology first then schedule an office visit prior to arranging colonoscopy.

## 2024-03-14 NOTE — Telephone Encounter (Signed)
 Pt scheduled to see Cardiology 05/2024  Called pt, LMOVM to call back

## 2024-03-16 NOTE — Telephone Encounter (Signed)
 LMOVM to return call  Pt left vm returning call

## 2024-04-06 NOTE — Telephone Encounter (Signed)
 Pt's daughter Jorene (on dpr) advised that pt needs to see cardiology and then make an OV with our office prior to being scheduled. Verbalized understanding.

## 2024-04-17 ENCOUNTER — Other Ambulatory Visit: Payer: Self-pay | Admitting: Internal Medicine

## 2024-04-17 DIAGNOSIS — I10 Essential (primary) hypertension: Secondary | ICD-10-CM

## 2024-04-26 NOTE — Progress Notes (Signed)
 WYNTON HUFSTETLER                                          MRN: [id number]   04/26/2024   The [facility] Quality Team Specialist reviewed this patient medical record for the purposes of chart review for care gap closure. The following were reviewed: chart review for care gap closure-colorectal cancer screening. Pcp note from 03/10/2024 to hold off on colonoscopy    [facility] Quality Team

## 2024-05-15 ENCOUNTER — Telehealth: Payer: Self-pay | Admitting: *Deleted

## 2024-05-15 NOTE — Telephone Encounter (Signed)
 Pt's daughter Jorene (on dpr) called and stated pt has an upcoming visit with cardiology on 05/23/24 and wanted to see if clearance could be sent to delay pt's procedure any further. Advised Devon that pt will still need an OV prior to being scheduled and can send clearance to cardiology. She will also go ahead and schedule OV after the cardiology appointment. She was transferred to front to make appointment.  Clearance has been sent

## 2024-05-15 NOTE — Telephone Encounter (Signed)
 Primary Cardiologist:None  Chart reviewed as part of pre-operative protocol coverage. Because of Thomas Baldwin's past medical history and time since last visit, he/she will require a follow-up visit in order to better assess preoperative cardiovascular risk.  Pre-op covering staff: - Patient already has appointment with Dr. Alvan on 05/23/24 for clearance. - Please contact requesting surgeon's office via preferred method (i.e, phone, fax) to inform them of need for appointment prior to surgery.  Thomas EMERSON Bane, NP-C  05/15/2024, 12:18 PM [address], [address] [place], [place] [address] Office [phone] Fax [phone]

## 2024-05-15 NOTE — Telephone Encounter (Signed)
 Pt has an upcoming appointment on 05/23/24.        Request for patient to stop medication prior to procedure or is needing cleareance  05/15/24  Thomas Baldwin Dec 17, 1955  What type of surgery is being performed? Colonoscopy  When is surgery scheduled? TBD  What type of clearance is required (medical or pharmacy to hold medication or both? Medical    Patient is needing to have cardiac clearance due to having  history of paroxysmal a fib, prior ECHO in 2013 with mildly reduced EF before he can be scheduled for procedure.  Name of physician performing surgery?  Dr.Rourk [facility] at [employer]: [phone], option 5 Fax: [phone]  Anesthesia type (none, local, MAC, general)? MAC   ? Yes ? No Patient can hold medication as requested   Signature: ___________________________

## 2024-05-17 ENCOUNTER — Encounter: Payer: Self-pay | Admitting: Cardiology

## 2024-05-23 ENCOUNTER — Ambulatory Visit: Attending: Cardiology | Admitting: Cardiology

## 2024-05-23 ENCOUNTER — Other Ambulatory Visit (HOSPITAL_BASED_OUTPATIENT_CLINIC_OR_DEPARTMENT_OTHER): Payer: Self-pay

## 2024-05-23 ENCOUNTER — Encounter: Payer: Self-pay | Admitting: Cardiology

## 2024-05-23 VITALS — BP 150/90 | HR 75 | Ht 78.5 in | Wt 245.4 lb

## 2024-05-23 DIAGNOSIS — I10 Essential (primary) hypertension: Secondary | ICD-10-CM

## 2024-05-23 DIAGNOSIS — I5022 Chronic systolic (congestive) heart failure: Secondary | ICD-10-CM

## 2024-05-23 DIAGNOSIS — I5189 Other ill-defined heart diseases: Secondary | ICD-10-CM | POA: Diagnosis not present

## 2024-05-23 DIAGNOSIS — I428 Other cardiomyopathies: Secondary | ICD-10-CM

## 2024-05-23 DIAGNOSIS — Z01810 Encounter for preprocedural cardiovascular examination: Secondary | ICD-10-CM

## 2024-05-23 DIAGNOSIS — I4891 Unspecified atrial fibrillation: Secondary | ICD-10-CM | POA: Diagnosis not present

## 2024-05-23 MED ORDER — APIXABAN 5 MG PO TABS
5.0000 mg | ORAL_TABLET | Freq: Two times a day (BID) | ORAL | 6 refills | Status: DC
  Filled 2024-05-23 – 2024-06-05 (×2): qty 60, 30d supply, fill #0
  Filled 2024-07-04: qty 60, 30d supply, fill #1

## 2024-05-23 NOTE — Progress Notes (Signed)
 Clinical Summary Thomas Baldwin is a 68 y.o.male seen today as a new patient for the following medical problems.   Last seen by Dr Thomas Baldwin in 2013   1.Afib - was PAF back in 2013 - was on pradaxa  at the time for anticoagulation. At some point stopped taking, he does not recall - CHADS2Vasc score is 2(age, HTN,)  - no recent palpitations - mixed compliance with meds  2.HTN - mixed compliance with meds, sometimes forgets.   3.Preoperative evaluation - plans for colonscopy    4. Low normal to mildy reduced LV systolic dysfunction - 2013 echo LVEF 45-50% - no SOB/DOE, no recent edema -  Past Medical History:  Diagnosis Date   Atrial fibrillation (HCC)    Atypical chest pain    GERD (gastroesophageal reflux disease)    Questionable   Hematuria none since 07-22-2019   Hypertension    Nerve damage    left hand     No Known Allergies   Current Outpatient Medications  Medication Sig Dispense Refill   amLODipine  (NORVASC ) 5 MG tablet TAKE 1 TABLET BY MOUTH DAILY 100 tablet 0   losartan  (COZAAR ) 100 MG tablet TAKE 1 TABLET BY MOUTH DAILY 100 tablet 0   metoprolol  tartrate (LOPRESSOR ) 50 MG tablet TAKE 1 TABLET BY MOUTH TWICE  DAILY 200 tablet 0   omeprazole  (PRILOSEC) 20 MG capsule TAKE 1 CAPSULE BY MOUTH DAILY 100 capsule 0   traZODone  (DESYREL ) 150 MG tablet TAKE 1 TABLET BY MOUTH AT  BEDTIME 90 tablet 3   No current facility-administered medications for this visit.     Past Surgical History:  Procedure Laterality Date   colonscopy  2010   CYSTOSCOPY WITH RETROGRADE PYELOGRAM, URETEROSCOPY AND STENT PLACEMENT Bilateral 08/04/2019   Procedure: CYSTOSCOPY WITH BILATERAL  RETROGRADE PYELOGRAM, LEFT DIAGNOSTIC URETEROSCOPY  AND POSSIBLE BIOPSY AND STENT PLACEMENT;  Surgeon: Thomas Morene ORN, MD;  Location: [facility];  Service: Urology;  Laterality: Bilateral;   LAPAROSCOPIC NEPHRECTOMY, HAND ASSISTED Left 08/24/2019   Procedure: LAPAROSCOPIC LEFT  NEPHRECTOMY;  Surgeon: Thomas Morene ORN, MD;  Location: [facility];  Service: Urology;  Laterality: Left;   NOSE SURGERY  1980   TONSILECTOMY, ADENOIDECTOMY, BILATERAL MYRINGOTOMY AND TUBES       No Known Allergies    Family History  Problem Relation Age of Onset   Coronary artery disease Other    Diabetes Other    Hypertension Other    Colon cancer Neg Hx      Social History Mr. Thomas Baldwin reports that he has been smoking cigars. He has never used smokeless tobacco. Mr. Thomas Baldwin reports current alcohol use of about 21.0 standard drinks of alcohol per week.     Physical Examination Today's Vitals   05/23/24 1359 05/23/24 1407 05/23/24 1427  BP: (!) 176/90 (!) 162/88 (!) 150/90  Pulse: 75    SpO2: 97%    Weight: 245 lb 6.4 oz (111.3 kg)    Height: 6' 6.5 (1.994 m)     Body mass index is 28 kg/m.  Gen: resting comfortably, no acute distress HEENT: no scleral icterus, pupils equal round and reactive, no palptable cervical adenopathy,  CV: irreg, no m/rg, no jvd Resp: Clear to auscultation bilaterally GI: abdomen is soft, non-tender, non-distended, normal bowel sounds, no hepatosplenomegaly MSK: extremities are warm, no edema.  Skin: warm, no rash Neuro:  no focal deficits Psych: appropriate affect   Diagnostic Studies  2013 echo  Study Conclusions   -  Left ventricle: The cavity size was normal. Wall thickness    was normal. Systolic function was mildly reduced. The    estimated ejection fraction was in the range of 45% to    50%. Diffuse hypokinesis. Features are consistent with a    pseudonormal left ventricular filling pattern, with    concomitant abnormal relaxation and increased filling    pressure (grade 2 diastolic dysfunction).  - Left atrium: The atrium was mildly dilated.   Assessment and Plan  1.Afib - prior history of afib - EKG today shows rate controlled afib, he is asymptomatic - has not been on anticoag, CHADS2Vasc score is 2, will start eliquis  5mg  bid - continue metoprolol  for rate control  2. HTN - eleveted here, repeat bp check when comes for echo  3. LV systolic dysfunction - low normal to mildly decreased LVEF in 2013, will repeat echo   4.Preopetative evaluation - ok to proceed with colonscopy - can hold eliquis 2 days prior, resume day after     Dorn PHEBE Ross, M.D.

## 2024-05-23 NOTE — Patient Instructions (Addendum)
 Medication Instructions:   Begin Eliquis 5mg  twice a day   Continue all other medications.     Labwork:  none  Testing/Procedures:  Your physician has requested that you have an echocardiogram. Echocardiography is a painless test that uses sound waves to create images of your heart. It provides your doctor with information about the size and shape of your heart and how well your heart's chambers and valves are working. This procedure takes approximately one hour. There are no restrictions for this procedure. Please do NOT wear cologne, perfume, aftershave, or lotions (deodorant is allowed). Please arrive 15 minutes prior to your appointment time.  Please note: We ask at that you not bring children with you during ultrasound (echo/ vascular) testing. Due to room size and safety concerns, children are not allowed in the ultrasound rooms during exams. Our front office staff cannot provide observation of children in our lobby area while testing is being conducted. An adult accompanying a patient to their appointment will only be allowed in the ultrasound room at the discretion of the ultrasound technician under special circumstances. We apologize for any inconvenience.  Office will contact with results via phone, letter or mychart.     Follow-Up:  6 months   Any Other Special Instructions Will Be Listed Below (If Applicable).  Nurse BP check when comes for Echo   If you need a refill on your cardiac medications before your next appointment, please call your pharmacy.

## 2024-06-01 ENCOUNTER — Ambulatory Visit: Admitting: Cardiology

## 2024-06-02 ENCOUNTER — Other Ambulatory Visit (HOSPITAL_BASED_OUTPATIENT_CLINIC_OR_DEPARTMENT_OTHER): Payer: Self-pay

## 2024-06-05 ENCOUNTER — Other Ambulatory Visit (HOSPITAL_BASED_OUTPATIENT_CLINIC_OR_DEPARTMENT_OTHER): Payer: Self-pay

## 2024-06-06 ENCOUNTER — Ambulatory Visit

## 2024-06-06 ENCOUNTER — Ambulatory Visit: Attending: Cardiology

## 2024-06-06 DIAGNOSIS — I5189 Other ill-defined heart diseases: Secondary | ICD-10-CM | POA: Diagnosis not present

## 2024-06-07 LAB — ECHOCARDIOGRAM COMPLETE
AR max vel: 2.34 cm2
AV Area VTI: 2.22 cm2
AV Area mean vel: 2.68 cm2
AV Mean grad: 3.1 mmHg
AV Peak grad: 6.3 mmHg
Ao pk vel: 1.25 m/s
Area-P 1/2: 3.58 cm2
Calc EF: 42.9 %
S' Lateral: 4.6 cm
Single Plane A2C EF: 44.5 %
Single Plane A4C EF: 44.5 %

## 2024-06-13 ENCOUNTER — Ambulatory Visit: Admitting: Internal Medicine

## 2024-06-14 ENCOUNTER — Other Ambulatory Visit (HOSPITAL_BASED_OUTPATIENT_CLINIC_OR_DEPARTMENT_OTHER): Payer: Self-pay

## 2024-06-14 ENCOUNTER — Ambulatory Visit: Attending: Cardiology | Admitting: *Deleted

## 2024-06-14 VITALS — BP 132/86 | HR 96 | Wt 247.0 lb

## 2024-06-14 DIAGNOSIS — I10 Essential (primary) hypertension: Secondary | ICD-10-CM

## 2024-06-14 MED ORDER — OMRON 3 SERIES BP MONITOR DEVI
0 refills | Status: AC
  Filled 2024-06-14 – 2024-07-04 (×2): qty 1, 30d supply, fill #0

## 2024-06-14 NOTE — Progress Notes (Signed)
 Patient in office for nurse BP check -   132/86  96   Will send him in new prescription for BP monitor as he said his old one broke.

## 2024-06-16 ENCOUNTER — Encounter: Payer: Self-pay | Admitting: *Deleted

## 2024-06-16 NOTE — Progress Notes (Signed)
 Patient notified via mychart

## 2024-06-19 ENCOUNTER — Telehealth: Payer: Self-pay | Admitting: *Deleted

## 2024-06-19 ENCOUNTER — Encounter: Payer: Self-pay | Admitting: Gastroenterology

## 2024-06-19 ENCOUNTER — Ambulatory Visit: Admitting: Gastroenterology

## 2024-06-19 VITALS — BP 130/92 | HR 76 | Temp 96.7°F | Ht 78.0 in | Wt 254.0 lb

## 2024-06-19 DIAGNOSIS — Z01818 Encounter for other preprocedural examination: Secondary | ICD-10-CM | POA: Diagnosis not present

## 2024-06-19 DIAGNOSIS — Z7901 Long term (current) use of anticoagulants: Secondary | ICD-10-CM

## 2024-06-19 DIAGNOSIS — Z79899 Other long term (current) drug therapy: Secondary | ICD-10-CM | POA: Insufficient documentation

## 2024-06-19 NOTE — Progress Notes (Signed)
 GI Office Note    Referring Provider: Bevely Doffing, FNP Primary Care Physician:  Bevely Doffing, FNP  Primary Gastroenterologist: Ozell Hollingshead, MD   Chief Complaint   Chief Complaint  Patient presents with   Colonoscopy     History of Present Illness   Thomas Baldwin is a 68 y.o. male presenting today to schedule a colonoscopy. He presented for evaluation due to high risk medication use.   Discussed the use of AI scribe software for clinical note transcription with the patient, who gave verbal consent to proceed.  History of Present Illness Thomas Baldwin is a 67 year old male who presents for a follow-up colonoscopy.  His last colonoscopy was in 2008 and was normal with no polyps found. He is asymptomatic with no bowel issues, abdominal pain, or blood in the stool. There is no family history of colon cancer or esophageal cancer.  He has a history of gastroesophageal reflux disease (GERD) managed with omeprazole , which effectively controls his symptoms unless he consumes very spicy foods, in which case he uses Tums as needed. He has not had an upper endoscopy. He has been on medication for a few years. Previously controlled with avoiding trigger foods. No dysphagia. No abdominal pain. BMs regular. No melena, brbpr.   He consumes approximately three beers a day, although he notes that he drinks less now than in the past, especially since his wife's passing. He is retired and notes a decrease in physical activity.   Medications   Current Outpatient Medications  Medication Sig Dispense Refill   amLODipine  (NORVASC ) 5 MG tablet TAKE 1 TABLET BY MOUTH DAILY 100 tablet 0   apixaban  (ELIQUIS ) 5 MG TABS tablet Take 1 tablet (5 mg total) by mouth 2 (two) times daily. 60 tablet 6   Blood Pressure Monitoring (OMRON 3 SERIES BP MONITOR) DEVI Use as directed 1 each 0   losartan  (COZAAR ) 100 MG tablet TAKE 1 TABLET BY MOUTH DAILY 100 tablet 0   metoprolol  tartrate (LOPRESSOR ) 50 MG  tablet TAKE 1 TABLET BY MOUTH TWICE  DAILY 200 tablet 0   omeprazole  (PRILOSEC) 20 MG capsule TAKE 1 CAPSULE BY MOUTH DAILY 100 capsule 0   traZODone  (DESYREL ) 150 MG tablet TAKE 1 TABLET BY MOUTH AT  BEDTIME 90 tablet 3   No current facility-administered medications for this visit.    Allergies   Allergies as of 06/19/2024   (No Known Allergies)    Past Medical History   Past Medical History:  Diagnosis Date   Atrial fibrillation (HCC)    Atypical chest pain    GERD (gastroesophageal reflux disease)    Questionable   Hematuria none since 07-22-2019   Hypertension    Nerve damage    left hand    Past Surgical History   Past Surgical History:  Procedure Laterality Date   colonscopy  2010   CYSTOSCOPY WITH RETROGRADE PYELOGRAM, URETEROSCOPY AND STENT PLACEMENT Bilateral 08/04/2019   Procedure: CYSTOSCOPY WITH BILATERAL  RETROGRADE PYELOGRAM, LEFT DIAGNOSTIC URETEROSCOPY  AND POSSIBLE BIOPSY AND STENT PLACEMENT;  Surgeon: Cam Morene ORN, MD;  Location: [facility];  Service: Urology;  Laterality: Bilateral;   LAPAROSCOPIC NEPHRECTOMY, HAND ASSISTED Left 08/24/2019   Procedure: LAPAROSCOPIC LEFT NEPHRECTOMY;  Surgeon: Cam Morene ORN, MD;  Location: [facility];  Service: Urology;  Laterality: Left;   NOSE SURGERY  1980   TONSILECTOMY, ADENOIDECTOMY, BILATERAL MYRINGOTOMY AND TUBES      Past Family History   Family History  Problem Relation Age of Onset   Coronary artery disease Other    Diabetes Other    Hypertension Other    Colon cancer Neg Hx     Past Social History   Social History   Socioeconomic History   Marital status: Married    Spouse name: Not on file   Number of children: Not on file   Years of education: Not on file   Highest education level: Associate degree: occupational, scientist, product/process development, or vocational program  Occupational History   Not on file  Tobacco Use   Smoking status: Some Days    Types: Cigars   Smokeless tobacco: Never    Tobacco comments:    occ  Vaping Use   Vaping status: Never Used  Substance and Sexual Activity   Alcohol use: Yes    Alcohol/week: 21.0 standard drinks of alcohol    Types: 21 Cans of beer per week    Comment: approx 3 beers/day, some days none   Drug use: Not Currently   Sexual activity: Not on file  Other Topics Concern   Not on file  Social History Narrative   Not on file   Social Drivers of Health   Financial Resource Strain: Low Risk  (01/31/2024)   Overall Financial Resource Strain (CARDIA)    Difficulty of Paying Living Expenses: Not hard at all  Food Insecurity: No Food Insecurity (01/31/2024)   Hunger Vital Sign    Worried About Running Out of Food in the Last Year: Never true    Ran Out of Food in the Last Year: Never true  Transportation Needs: No Transportation Needs (01/31/2024)   PRAPARE - Administrator, Civil Service (Medical): No    Lack of Transportation (Non-Medical): No  Physical Activity: Inactive (01/31/2024)   Exercise Vital Sign    Days of Exercise per Week: 0 days    Minutes of Exercise per Session: Not on file  Stress: Stress Concern Present (01/31/2024)   [employer] of Occupational Health - Occupational Stress Questionnaire    Feeling of Stress: Very much  Social Connections: Moderately Isolated (01/31/2024)   Social Connection and Isolation Panel    Frequency of Communication with Friends and Family: Three times a week    Frequency of Social Gatherings with Friends and Family: Once a week    Attends Religious Services: Never    Database Administrator or Organizations: No    Attends Engineer, Structural: Not on file    Marital Status: Married  Catering Manager Violence: Not At Risk (12/17/2023)   Humiliation, Afraid, Rape, and Kick questionnaire    Fear of Current or Ex-Partner: No    Emotionally Abused: No    Physically Abused: No    Sexually Abused: No    Review of Systems   General: Negative for anorexia, weight  loss, fever, chills, fatigue, weakness. Eyes: Negative for vision changes.  ENT: Negative for hoarseness, difficulty swallowing , nasal congestion. CV: Negative for chest pain, angina, palpitations, dyspnea on exertion, peripheral edema.  Respiratory: Negative for dyspnea at rest, dyspnea on exertion, cough, sputum, wheezing.  GI: See history of present illness. GU:  Negative for dysuria, hematuria, urinary incontinence, urinary frequency, nocturnal urination.  MS: Negative for joint pain, low back pain.  Derm: Negative for rash or itching.  Neuro: Negative for weakness, abnormal sensation, seizure, frequent headaches, memory loss,  confusion.  Psych: Negative for anxiety, depression, suicidal ideation, hallucinations.  Endo: Negative for unusual weight change.  Heme: Negative for bruising or bleeding. Allergy: Negative for rash or hives.  Physical Exam   BP (!) 130/92   Pulse 76   Temp (!) 96.7 F (35.9 C) (Temporal)   Ht 6' 6 (1.981 m)   Wt 254 lb (115.2 kg)   SpO2 98%   BMI 29.35 kg/m    General: Well-nourished, well-developed in no acute distress.  Head: Normocephalic, atraumatic.   Eyes: Conjunctiva pink, no icterus. Mouth: Oropharyngeal mucosa moist and pink  Neck: Supple without thyromegaly, masses, or lymphadenopathy.  Lungs: Clear to auscultation bilaterally.  Heart: Regular rate and irregular rhythm, no murmurs rubs or gallops.  Abdomen: Bowel sounds are normal, nontender, nondistended, no hepatosplenomegaly or masses,  no abdominal bruits or hernia, no rebound or guarding.   Rectal: not performed Extremities: No lower extremity edema. No clubbing or deformities.  Neuro: Alert and oriented x 4 , grossly normal neurologically.  Skin: Warm and dry, no rash or jaundice.   Psych: Alert and cooperative, normal mood and affect.  Labs   None available  Imaging Studies   ECHOCARDIOGRAM COMPLETE Result Date: 06/07/2024    ECHOCARDIOGRAM REPORT   Patient Name:    Thomas Baldwin Date of Exam: 06/06/2024 Medical Rec #:  [id number]     Height:       78.5 in Accession #:    [id number]    Weight:       245.4 lb Date of Birth:  22-Jan-1956    BSA:          2.474 m Patient Age:    67 years      BP:           156/90 mmHg Patient Gender: M             HR:           62 bpm. Exam Location:  [facility] Procedure: 2D Echo, Cardiac Doppler and Color Doppler (Both Spectral and Color            Flow Doppler were utilized during procedure). Indications:    I51.89 Diastolic dysfunction  History:        Patient has prior history of Echocardiogram examinations, most                 recent 01/13/2012. Arrythmias:Atrial Fibrillation,                 Signs/Symptoms:Chest Pain; Risk Factors:Hypertension and Current                 Smoker.  Sonographer:    Bascom Burows RCS, RVS Referring Phys: [id number] DORN FALCON BRANCH IMPRESSIONS  1. Left ventricular ejection fraction, by estimation, is 40 to 45%. The left ventricle has mildly decreased function. The left ventricle demonstrates global hypokinesis. The left ventricular internal cavity size was mildly dilated. Left ventricular diastolic parameters are indeterminate.  2. Right ventricular systolic function is normal. The right ventricular size is mildly enlarged. There is mildly elevated pulmonary artery systolic pressure.  3. Left atrial size was mildly dilated.  4. The mitral valve is abnormal. Mild mitral valve regurgitation. No evidence of mitral stenosis.  5. The aortic valve has an indeterminant number of cusps. Aortic valve regurgitation is not visualized. No aortic stenosis is present.  6. The inferior vena cava is normal in size with greater than 50% respiratory variability, suggesting right atrial pressure of 3 mmHg. Comparison(s): A prior study was performed on 01/13/2012. EF 45-50%. LA mildly dilated. FINDINGS  Left Ventricle:  Left ventricular ejection fraction, by estimation, is 40 to 45%. The left ventricle has mildly decreased function. The left  ventricle demonstrates global hypokinesis. The left ventricular internal cavity size was mildly dilated. There is  no left ventricular hypertrophy. Left ventricular diastolic parameters are indeterminate. Right Ventricle: The right ventricular size is mildly enlarged. Right vetricular wall thickness was not well visualized. Right ventricular systolic function is normal. There is mildly elevated pulmonary artery systolic pressure. The tricuspid regurgitant  velocity is 3.07 m/s, and with an assumed right atrial pressure of 3 mmHg, the estimated right ventricular systolic pressure is 40.7 mmHg. Left Atrium: Left atrial size was mildly dilated. Right Atrium: Right atrial size was normal in size. Pericardium: There is no evidence of pericardial effusion. Mitral Valve: The mitral valve is abnormal. Mild mitral valve regurgitation. No evidence of mitral valve stenosis. MV peak gradient, 3.8 mmHg. The mean mitral valve gradient is 1.5 mmHg. Tricuspid Valve: The tricuspid valve is normal in structure. Tricuspid valve regurgitation is not demonstrated. No evidence of tricuspid stenosis. Aortic Valve: The aortic valve has an indeterminant number of cusps. Aortic valve regurgitation is not visualized. No aortic stenosis is present. Aortic valve mean gradient measures 3.1 mmHg. Aortic valve peak gradient measures 6.3 mmHg. Aortic valve area, by VTI measures 2.22 cm. Pulmonic Valve: The pulmonic valve was not well visualized. Pulmonic valve regurgitation is not visualized. No evidence of pulmonic stenosis. Aorta: The aortic root and ascending aorta are structurally normal, with no evidence of dilitation. Venous: The inferior vena cava is normal in size with greater than 50% respiratory variability, suggesting right atrial pressure of 3 mmHg. IAS/Shunts: No atrial level shunt detected by color flow Doppler.  LEFT VENTRICLE PLAX 2D LVIDd:         6.10 cm LVIDs:         4.60 cm LV PW:         1.00 cm LV IVS:        0.90 cm LVOT  diam:     2.40 cm LV SV:         65 LV SV Index:   26 LVOT Area:     4.52 cm  LV Volumes (MOD) LV vol d, MOD A2C: 105.0 ml LV vol d, MOD A4C: 86.9 ml LV vol s, MOD A2C: 58.3 ml LV vol s, MOD A4C: 48.2 ml LV SV MOD A2C:     46.7 ml LV SV MOD A4C:     86.9 ml LV SV MOD BP:      41.1 ml RIGHT VENTRICLE             IVC RV Basal diam:  4.50 cm     IVC diam: 2.10 cm RV Mid diam:    3.20 cm RV S prime:     11.60 cm/s  PULMONARY VEINS TAPSE (M-mode): 2.2 cm      Diastolic Velocity: 72.00 cm/s                             S/D Velocity:       0.40                             Systolic Velocity:  27.00 cm/s LEFT ATRIUM             Index        RIGHT ATRIUM  Index LA diam:        4.80 cm 1.94 cm/m   RA Area:     26.00 cm LA Vol (A2C):   95.6 ml 38.64 ml/m  RA Volume:   85.60 ml  34.60 ml/m LA Vol (A4C):   85.1 ml 34.39 ml/m LA Biplane Vol: 94.0 ml 37.99 ml/m  AORTIC VALVE                    PULMONIC VALVE AV Area (Vmax):    2.34 cm     PV Vmax:       0.76 m/s AV Area (Vmean):   2.68 cm     PV Peak grad:  2.3 mmHg AV Area (VTI):     2.22 cm AV Vmax:           125.42 cm/s AV Vmean:          79.618 cm/s AV VTI:            0.294 m AV Peak Grad:      6.3 mmHg AV Mean Grad:      3.1 mmHg LVOT Vmax:         64.91 cm/s LVOT Vmean:        47.227 cm/s LVOT VTI:          0.145 m LVOT/AV VTI ratio: 0.49  AORTA Ao Root diam: 3.90 cm Ao Asc diam:  3.40 cm MITRAL VALVE               TRICUSPID VALVE MV Area (PHT): 3.58 cm    TR Peak grad:   37.7 mmHg MV Peak grad:  3.8 mmHg    TR Vmax:        307.00 cm/s MV Mean grad:  1.5 mmHg MV Vmax:       0.97 m/s    SHUNTS MV Vmean:      52.8 cm/s   Systemic VTI:  0.14 m MV Decel Time: 212 msec    Systemic Diam: 2.40 cm MV E velocity: 84.80 cm/s MV A velocity: 43.30 cm/s MV E/A ratio:  1.96 Dorn Ross MD Electronically signed by Dorn Ross MD Signature Date/Time: 06/07/2024/1:02:48 PM    Final     Assessment/Plan:     Assessment & Plan Colorectal cancer screening Last  colonoscopy in 2008, unremarkable. No FH of CRC. Recently seen by cardiology for follow up. Approval provided to hold Eliquis  for 48 hours prior to procedure. He has rate controlled Afib.   - Scheduled colonoscopy. ASA 3. Room 1,2.  I have discussed the risks, alternatives, benefits with regards to but not limited to the risk of reaction to medication, bleeding, infection, perforation and the patient is agreeable to proceed. Written consent to be obtained. - Hold Eliquis  48 hours prior.             Sonny RAMAN. Ezzard, MHS, PA-C [facility]

## 2024-06-19 NOTE — Patient Instructions (Signed)
 Colonoscopy to be scheduled. See separate instructions. You will hold your Eliquis  48 hours before your colonoscopy.

## 2024-06-19 NOTE — Telephone Encounter (Signed)
 LMOVM to return call  TCS w/Dr.Rourk, asa 3, rm 1-2 ok

## 2024-06-20 NOTE — Telephone Encounter (Signed)
 Daughter called back. Stated to call once we get January scedule

## 2024-06-23 ENCOUNTER — Encounter: Payer: Self-pay | Admitting: *Deleted

## 2024-06-23 ENCOUNTER — Other Ambulatory Visit: Payer: Self-pay | Admitting: *Deleted

## 2024-06-23 MED ORDER — PEG 3350-KCL-NA BICARB-NACL 420 G PO SOLR: 4000.0000 mL | Freq: Once | ORAL | 0 refills | Status: AC

## 2024-06-23 NOTE — Telephone Encounter (Signed)
 Pt has been scheduled for 07/19/24. Instructions mailed and prep sent to pharmacy.

## 2024-06-28 ENCOUNTER — Other Ambulatory Visit (HOSPITAL_BASED_OUTPATIENT_CLINIC_OR_DEPARTMENT_OTHER): Payer: Self-pay

## 2024-06-30 ENCOUNTER — Ambulatory Visit: Payer: Self-pay | Admitting: Cardiology

## 2024-07-04 ENCOUNTER — Telehealth: Payer: Self-pay

## 2024-07-04 ENCOUNTER — Other Ambulatory Visit (HOSPITAL_BASED_OUTPATIENT_CLINIC_OR_DEPARTMENT_OTHER): Payer: Self-pay

## 2024-07-04 ENCOUNTER — Encounter: Payer: Self-pay | Admitting: *Deleted

## 2024-07-04 NOTE — Telephone Encounter (Signed)
-----   Message from Nurse Jinnie LABOR, RN sent at 07/04/2024  1:22 PM EST ----- Regarding: ELIQUIS  ASSISTANCE Hi, This patient can not afford his eliquis . Can you all see if you can help with the grant?

## 2024-07-04 NOTE — Telephone Encounter (Signed)
 Please clarify if patient has a diagnosis of cardiomyopathy.

## 2024-07-05 NOTE — Telephone Encounter (Signed)
 Can someone add an ICD-10 related to cardiomyopathy if appropriate. Needs to be I42...something. [facility] will not accept I50 codes for diagnosis verification on cardiomyopathy.

## 2024-07-10 ENCOUNTER — Other Ambulatory Visit: Payer: Self-pay | Admitting: *Deleted

## 2024-07-10 ENCOUNTER — Encounter (HOSPITAL_BASED_OUTPATIENT_CLINIC_OR_DEPARTMENT_OTHER): Payer: Self-pay

## 2024-07-10 ENCOUNTER — Other Ambulatory Visit (HOSPITAL_COMMUNITY): Payer: Self-pay

## 2024-07-10 ENCOUNTER — Telehealth: Payer: Self-pay | Admitting: Pharmacy Technician

## 2024-07-10 ENCOUNTER — Other Ambulatory Visit (HOSPITAL_BASED_OUTPATIENT_CLINIC_OR_DEPARTMENT_OTHER): Payer: Self-pay

## 2024-07-10 ENCOUNTER — Encounter: Payer: Self-pay | Admitting: Cardiology

## 2024-07-10 MED ORDER — APIXABAN 5 MG PO TABS: 5.0000 mg | ORAL_TABLET | Freq: Two times a day (BID) | ORAL | 3 refills | Status: DC

## 2024-07-10 NOTE — Telephone Encounter (Signed)
 Pharmacy Patient Advocate Encounter   Received notification from Physician's Office that prior authorization for Eliquis  5 MG is required/requested.   Insurance verification completed.   The patient is insured through [place].   Per test claim: Refill too soon. PA is not needed at this time. Medication was filled 07/04/24. Next eligible fill date is 07/27/24.    Patient has a deductible

## 2024-07-10 NOTE — Telephone Encounter (Signed)
 That is perfect, appreciate you Dr. Alvan!

## 2024-07-11 ENCOUNTER — Other Ambulatory Visit (HOSPITAL_COMMUNITY): Payer: Self-pay

## 2024-07-14 ENCOUNTER — Encounter: Payer: Self-pay | Admitting: *Deleted

## 2024-07-14 NOTE — Telephone Encounter (Signed)
 Patient Advocate Encounter   The patient was approved for a [employer] grant that will help cover the cost of ELIQUIS  Total amount awarded, $4,500.  Effective: 06/11/24 - 06/10/25   APW:[id number] ERW:[id number] Hmnle:[id number] PI:[id number]   *I don't think [facility] mail order pharmacy can apply grants to claims. Patient will need to fill at retail or [facility] to utilize grant funds.   Ileana Lehmann, CPhT  Pharmacy Patient Advocate Specialist  Direct Number: [phone] Fax: [phone]

## 2024-07-14 NOTE — Telephone Encounter (Signed)
 Pt needed to reschedule his procedure due to being sick. He has been rescheduled for 08/11/24 at 10:45 am.  Updated instructions mailed

## 2024-07-17 ENCOUNTER — Other Ambulatory Visit (HOSPITAL_COMMUNITY)

## 2024-07-17 ENCOUNTER — Other Ambulatory Visit (HOSPITAL_BASED_OUTPATIENT_CLINIC_OR_DEPARTMENT_OTHER): Payer: Self-pay

## 2024-07-17 ENCOUNTER — Ambulatory Visit (INDEPENDENT_AMBULATORY_CARE_PROVIDER_SITE_OTHER)

## 2024-07-17 VITALS — BP 131/76 | HR 66 | Ht 78.0 in | Wt 245.1 lb

## 2024-07-17 DIAGNOSIS — J069 Acute upper respiratory infection, unspecified: Secondary | ICD-10-CM | POA: Diagnosis not present

## 2024-07-17 MED ORDER — APIXABAN 5 MG PO TABS
5.0000 mg | ORAL_TABLET | Freq: Two times a day (BID) | ORAL | 1 refills | Status: AC
  Filled 2024-07-17: qty 180, 90d supply, fill #0

## 2024-07-17 MED ORDER — AZITHROMYCIN 250 MG PO TABS: ORAL_TABLET | ORAL | 0 refills | Status: AC

## 2024-07-17 NOTE — Telephone Encounter (Signed)
 Patient informed and verbalized understanding of plan.

## 2024-07-17 NOTE — Progress Notes (Signed)
 "  Established Patient Office Visit  Subjective   Patient ID: Thomas Baldwin, male    DOB: 08/18/1955  Age: 69 y.o. MRN: [id number]  Chief Complaint  Patient presents with   Medical Management of Chronic Issues    Pt states lingereing cough no fever hasn't really been eating having a lot of flim and and cough still present, took a home covid test 5 days ago was negative but didn't realize test was expired he's never had covid in the past no flu like symptoms eaither states he's felt good other than the persistant cought and congestion in his chest     HPI Discussed the use of AI scribe software for clinical note transcription with the patient, who gave verbal consent to proceed.  History of Present Illness    Thomas Baldwin is a 69 year old male with atrial fibrillation who presents with a lingering cough and chest congestion.  Cough and chest congestion - Lingering cough and chest congestion for approximately one week, onset around December 27th - Congestion described as 'stuff in my chest' with occasional expectoration - No fever - Using over-the-counter medications including NyQuil, DayQuil, and other cough and cold medicines for symptom management  Cardiac symptoms - Atrial fibrillation managed with Eliquis  - Recent palpitations, uncertain if currently in atrial fibrillation - No history of cardioversion or similar procedures         Patient Active Problem List   Diagnosis Date Noted   High risk medication use 06/19/2024   Right posterior capsular opacification 04/06/2022   Encounter for screening colonoscopy 06/10/2020   Vitamin D  deficiency 04/21/2020   Retinal break of right eye 04/03/2020   Posterior vitreous detachment of right eye 04/03/2020   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/2020   Pseudophakia of right eye 04/03/2020   Pseudophakia, left eye 04/03/2020   Macular pucker, left eye 04/03/2020   Kidney mass  08/24/2019   Preventative health care 05/23/2019   Impaired glucose tolerance 12/07/2014   Atrial fibrillation (HCC) 01/08/2012   Essential hypertension 03/16/2007    ROS    Objective:     BP 131/76   Pulse 66   Ht 6' 6 (1.981 m)   Wt 245 lb 1.3 oz (111.2 kg)   SpO2 96%   BMI 28.32 kg/m  BP Readings from Last 3 Encounters:  07/17/24 131/76  06/19/24 (!) 130/92  06/14/24 132/86   Wt Readings from Last 3 Encounters:  07/17/24 245 lb 1.3 oz (111.2 kg)  06/19/24 254 lb (115.2 kg)  06/14/24 247 lb (112 kg)     Physical Exam Vitals and nursing note reviewed.  Constitutional:      Appearance: Normal appearance.  HENT:     Head: Normocephalic.     Right Ear: Tympanic membrane, ear canal and external ear normal.     Left Ear: Tympanic membrane, ear canal and external ear normal.     Nose: Congestion and rhinorrhea present.     Right Turbinates: Enlarged.     Left Turbinates: Enlarged.     Right Sinus: No maxillary sinus tenderness or frontal sinus tenderness.     Left Sinus: No maxillary sinus tenderness or frontal sinus tenderness.     Mouth/Throat:     Mouth: Mucous membranes are moist.     Pharynx: Oropharynx is clear.  Eyes:     Extraocular Movements: Extraocular movements intact.     Pupils: Pupils are equal, round, and reactive  to light.  Cardiovascular:     Rate and Rhythm: Normal rate and regular rhythm.  Pulmonary:     Effort: Pulmonary effort is normal.     Breath sounds: Normal breath sounds.  Abdominal:     General: Abdomen is flat. Bowel sounds are normal.     Palpations: Abdomen is soft.  Musculoskeletal:        General: Normal range of motion.     Cervical back: Normal range of motion and neck supple.  Skin:    General: Skin is warm and dry.  Neurological:     Mental Status: He is alert and oriented to person, place, and time.  Psychiatric:        Mood and Affect: Mood normal.        Thought Content: Thought content normal.     No results  found for any visits on 07/17/24.    The 10-year ASCVD risk score (Arnett DK, et al., 2019) is: 23.4%    Assessment & Plan:   Problem List Items Addressed This Visit   None Visit Diagnoses       Upper respiratory tract infection, unspecified type    -  Primary   Persistent cough and chest congestion negative COVID & flu - Prescribed azithromycin  (Z-Pak). - Advised continuation of OTC cough and cold medications.   Relevant Medications   azithromycin  (ZITHROMAX ) 250 MG tablet      No follow-ups on file.    Leita Longs, FNP  "

## 2024-07-17 NOTE — Addendum Note (Signed)
 Addended by: Rykker Coviello M on: 07/17/2024 04:47 PM   Modules accepted: Orders

## 2024-07-18 DIAGNOSIS — I10 Essential (primary) hypertension: Secondary | ICD-10-CM

## 2024-07-18 MED ORDER — AMLODIPINE BESYLATE 5 MG PO TABS: 5.0000 mg | ORAL_TABLET | Freq: Every day | ORAL | 0 refills | Status: AC

## 2024-07-18 MED ORDER — METOPROLOL TARTRATE 50 MG PO TABS: ORAL_TABLET | ORAL | 0 refills | Status: DC

## 2024-07-18 MED ORDER — OMEPRAZOLE 20 MG PO CPDR: 20.0000 mg | DELAYED_RELEASE_CAPSULE | Freq: Every day | ORAL | 0 refills | Status: AC

## 2024-07-18 MED ORDER — LOSARTAN POTASSIUM 100 MG PO TABS: 100.0000 mg | ORAL_TABLET | Freq: Every day | ORAL | 0 refills | Status: AC

## 2024-07-25 ENCOUNTER — Encounter: Payer: Self-pay | Admitting: Nurse Practitioner

## 2024-07-25 ENCOUNTER — Ambulatory Visit: Attending: Nurse Practitioner | Admitting: Nurse Practitioner

## 2024-07-25 VITALS — BP 178/102 | HR 83 | Ht 78.0 in | Wt 246.4 lb

## 2024-07-25 DIAGNOSIS — Z13220 Encounter for screening for lipoid disorders: Secondary | ICD-10-CM | POA: Diagnosis not present

## 2024-07-25 DIAGNOSIS — I10 Essential (primary) hypertension: Secondary | ICD-10-CM | POA: Diagnosis not present

## 2024-07-25 DIAGNOSIS — E785 Hyperlipidemia, unspecified: Secondary | ICD-10-CM

## 2024-07-25 DIAGNOSIS — I4891 Unspecified atrial fibrillation: Secondary | ICD-10-CM

## 2024-07-25 DIAGNOSIS — I5020 Unspecified systolic (congestive) heart failure: Secondary | ICD-10-CM

## 2024-07-25 MED ORDER — METOPROLOL TARTRATE 50 MG PO TABS: 75.0000 mg | ORAL_TABLET | Freq: Two times a day (BID) | ORAL | 3 refills | Status: AC

## 2024-07-25 NOTE — Progress Notes (Signed)
 " Cardiology Office Note   Date: 07/25/2024 ID:  Lymon, Kidney 21-Nov-1955, MRN [id number] PCP: Bevely Doffing, FNP  [facility] Providers Cardiologist:  Alvan Carrier, MD     History of Present Illness Thomas Baldwin is a 69 y.o. male with a PMH of A-fib, hypertension, cardiomyopathy, presents today for 4 to 6-week follow-up.  Previously diagnosed with A-fib back in 2013 and was previously on Pradaxa  at the time for anticoagulation.  Previous echo in 2013 showed LVEF 45 to 50%.  Last seen by Dr. Alvan on May 23, 2024.  He was pending future colonoscopy at that time but had mixed compliance with medications, at some point patient stopped taking oral anticoagulation.  Was started on Eliquis  5 mg twice daily for his A-fib.  Blood pressure is elevated at the time.  Echo was arranged-see full report below.  Here for follow-up today.  Here for follow-up today.  Tells me his wife passed away last 02-05-2025 and has been neglecting self-care and wants to start taking better care of himself.  Tells me blood pressures are typically borderline high.  Overall doing well. Denies any chest pain, shortness of breath, palpitations, syncope, presyncope, dizziness, orthopnea, PND, swelling or significant weight changes, acute bleeding, or claudication.   SH: Retired tax adviser.    ROS: Negative.  See HPI.  Studies Reviewed  EKG:  EKG Interpretation Date/Time:  Tuesday July 25 2024 10:37:24 EST Ventricular Rate:  103 PR Interval:    QRS Duration:  120 QT Interval:  322 QTC Calculation: 421 R Axis:   69  Text Interpretation: Atrial fibrillation with rapid ventricular response Incomplete left bundle branch block Minimal voltage criteria for LVH, may be normal variant ( Cornell product ) Nonspecific ST abnormality When compared with ECG of 23-May-2024 14:05, No significant change was found Confirmed by Miriam Norris [id number]) on 07/28/2024 1:20:52 PM   Echo 05/2024:  1. Left ventricular  ejection fraction, by estimation, is 40 to 45%. The  left ventricle has mildly decreased function. The left ventricle  demonstrates global hypokinesis. The left ventricular internal cavity size  was mildly dilated. Left ventricular  diastolic parameters are indeterminate.   2. Right ventricular systolic function is normal. The right ventricular  size is mildly enlarged. There is mildly elevated pulmonary artery  systolic pressure.   3. Left atrial size was mildly dilated.   4. The mitral valve is abnormal. Mild mitral valve regurgitation. No  evidence of mitral stenosis.   5. The aortic valve has an indeterminant number of cusps. Aortic valve  regurgitation is not visualized. No aortic stenosis is present.   6. The inferior vena cava is normal in size with greater than 50%  respiratory variability, suggesting right atrial pressure of 3 mmHg.   Comparison(s): A prior study was performed on 01/13/2012. EF 45-50%. LA  mildly dilated.     Physical Exam VS:  BP (!) 178/102 (BP Location: Right Arm)   Pulse 83   Ht 6' 6 (1.981 m)   Wt 246 lb 6.4 oz (111.8 kg)   SpO2 98%   BMI 28.47 kg/m        Wt Readings from Last 3 Encounters:  07/25/24 246 lb 6.4 oz (111.8 kg)  07/17/24 245 lb 1.3 oz (111.2 kg)  06/19/24 254 lb (115.2 kg)    GEN: Well nourished, well developed in no acute distress NECK: No JVD; No carotid bruits CARDIAC: S1/S2, RRR, no murmurs, rubs, gallops RESPIRATORY:  Clear to auscultation without  rales, wheezing or rhonchi  ABDOMEN: Soft, non-tender, non-distended EXTREMITIES:  No edema; No deformity   ASSESSMENT AND PLAN  HFmrEF Most recent echo from November 2025 showed LVEF 40 to 45%'s, mildly elevated PASP. Euvolemic and well compensated on exam.  Will increase metoprolol  tartrate to 75 mg twice daily and bring him back in 1 week for EKG visit.  Continue losartan .  If labs permit and blood pressure permits, plan to stop losartan  and switch to Entresto. Low sodium diet,  fluid restriction <2L, and daily weights encouraged. Educated to contact our office for weight gain of 2 lbs overnight or 5 lbs in one week.  Will obtain CBC, CMET.  HTN Blood pressures are elevated not at goal.  Will increase metoprolol  as noted above. Discussed to monitor BP at home at least 2 hours after medications and sitting for 5-10 minutes.  No other medication changes at this time. Heart healthy diet and regular cardiovascular exercise encouraged.  He will update us  in 2 to 3 weeks regarding his BP readings.  A-fib Heart rate slightly fast today.  Will increase metoprolol  as noted above.  Denies any recent tachycardia or palpitations.  Will bring him back in 1 week to repeat EKG.  Continue Eliquis  for stroke prevention.  4.  Screening for hyperlipidemia He is due for labs and will obtain FLP in addition to labs as noted above. Continue to follow with PCP.      Dispo: Follow-up with MD/APP in 2-3 months or sooner if anything changes.  Signed, Almarie Crate, NP   "

## 2024-07-25 NOTE — Patient Instructions (Addendum)
 Medication Instructions:  Your physician has recommended you make the following change in your medication:  Increase Metoprolol  Tartrate to 75 mg twice daily Continue taking all other medications as prescribed   Labwork: CBC, CMET and Fasting Lipid Panel to be completed in 1-2 weeks at [facility]  Testing/Procedures: None  Follow-Up: Your physician recommends that you schedule a follow-up appointment in: 1 week nurse visit with EKG and 2-3 months with Miriam  Any Other Special Instructions Will Be Listed Below (If Applicable). Please update update us  with Blood pressure readings in 2-3 weeks  Thank you for choosing [facility]!     If you need a refill on your cardiac medications before your next appointment, please call your pharmacy.

## 2024-08-01 ENCOUNTER — Telehealth: Payer: Self-pay | Admitting: *Deleted

## 2024-08-01 ENCOUNTER — Ambulatory Visit: Attending: Cardiology | Admitting: *Deleted

## 2024-08-01 DIAGNOSIS — I4891 Unspecified atrial fibrillation: Secondary | ICD-10-CM

## 2024-08-01 NOTE — Telephone Encounter (Signed)
-----   Message from Almarie Crate, NP sent at 08/01/2024  1:58 PM EST ----- Thanks Jinnie for seeing him. EKG shows rate controlled A-fib, rate is much better than last visit. Continue current treatment plan.   Thanks!  Best, Almarie Crate, NP

## 2024-08-01 NOTE — Patient Instructions (Signed)
Your physician recommends that you continue on your current medications as directed. Please refer to the Current Medication list given to you today. Your physician recommends that you schedule a follow-up appointment in: as planned 

## 2024-08-01 NOTE — Progress Notes (Signed)
 Presents for EKG per last office visit request by Almarie Crate NP-Will increase metoprolol  tartrate to 75 mg twice daily and bring him back in 1 week for EKG visit.   Medications reviewed Reports taking all medications as prescribed. Admits to missing two doses since increase. Denies side affects. Denies dizziness, chest pain or sob.   EKG done and routed to provider for review

## 2024-08-01 NOTE — Telephone Encounter (Signed)
 Patient informed and verbalized understanding of plan.

## 2024-08-04 LAB — COMPREHENSIVE METABOLIC PANEL WITH GFR
ALT: 24 IU/L (ref 0–44)
AST: 26 IU/L (ref 0–40)
Albumin: 4.5 g/dL (ref 3.9–4.9)
Alkaline Phosphatase: 85 IU/L (ref 47–123)
BUN/Creatinine Ratio: 11 (ref 10–24)
BUN: 15 mg/dL (ref 8–27)
Bilirubin Total: 0.8 mg/dL (ref 0.0–1.2)
CO2: 24 mmol/L (ref 20–29)
Calcium: 9.6 mg/dL (ref 8.6–10.2)
Chloride: 100 mmol/L (ref 96–106)
Creatinine, Ser: 1.37 mg/dL — ABNORMAL HIGH (ref 0.76–1.27)
Globulin, Total: 3 g/dL (ref 1.5–4.5)
Glucose: 114 mg/dL — ABNORMAL HIGH (ref 70–99)
Potassium: 5.1 mmol/L (ref 3.5–5.2)
Sodium: 142 mmol/L (ref 134–144)
Total Protein: 7.5 g/dL (ref 6.0–8.5)
eGFR: 56 mL/min/1.73 — ABNORMAL LOW

## 2024-08-04 LAB — LIPID PANEL
Chol/HDL Ratio: 4.3 ratio (ref 0.0–5.0)
Cholesterol, Total: 159 mg/dL (ref 100–199)
HDL: 37 mg/dL — ABNORMAL LOW
LDL Chol Calc (NIH): 92 mg/dL (ref 0–99)
Triglycerides: 170 mg/dL — ABNORMAL HIGH (ref 0–149)
VLDL Cholesterol Cal: 30 mg/dL (ref 5–40)

## 2024-08-04 LAB — CBC
Hematocrit: 47.6 % (ref 37.5–51.0)
Hemoglobin: 16.1 g/dL (ref 13.0–17.7)
MCH: 32.8 pg (ref 26.6–33.0)
MCHC: 33.8 g/dL (ref 31.5–35.7)
MCV: 97 fL (ref 79–97)
Platelets: 195 x10E3/uL (ref 150–450)
RBC: 4.91 x10E6/uL (ref 4.14–5.80)
RDW: 12.9 % (ref 11.6–15.4)
WBC: 5.9 x10E3/uL (ref 3.4–10.8)

## 2024-08-08 ENCOUNTER — Encounter (HOSPITAL_COMMUNITY): Payer: Self-pay

## 2024-08-08 ENCOUNTER — Other Ambulatory Visit: Payer: Self-pay

## 2024-08-08 ENCOUNTER — Encounter (HOSPITAL_COMMUNITY)
Admission: RE | Admit: 2024-08-08 | Discharge: 2024-08-08 | Disposition: A | Discharge status: Home / Self Care | Source: Ambulatory Visit | Attending: Internal Medicine | Admitting: Internal Medicine

## 2024-08-08 HISTORY — DX: Panic disorder (episodic paroxysmal anxiety): F41.0

## 2024-08-08 NOTE — Pre-Procedure Instructions (Signed)
 Attempted pre-op phone call. Left VM for him to call us back.

## 2024-08-11 ENCOUNTER — Ambulatory Visit (HOSPITAL_COMMUNITY): Admitting: Certified Registered Nurse Anesthetist

## 2024-08-11 ENCOUNTER — Ambulatory Visit (HOSPITAL_COMMUNITY)
Admission: RE | Admit: 2024-08-11 | Discharge: 2024-08-11 | Disposition: A | Discharge status: Home / Self Care | Attending: Internal Medicine | Admitting: Internal Medicine

## 2024-08-11 ENCOUNTER — Encounter (HOSPITAL_COMMUNITY): Payer: Self-pay | Admitting: Internal Medicine

## 2024-08-11 ENCOUNTER — Encounter (HOSPITAL_COMMUNITY)
Admission: RE | Disposition: A | Discharge status: Home / Self Care | Payer: Self-pay | Source: Home / Self Care | Attending: Internal Medicine

## 2024-08-11 DIAGNOSIS — D123 Benign neoplasm of transverse colon: Secondary | ICD-10-CM | POA: Diagnosis not present

## 2024-08-11 DIAGNOSIS — K219 Gastro-esophageal reflux disease without esophagitis: Secondary | ICD-10-CM | POA: Insufficient documentation

## 2024-08-11 DIAGNOSIS — Z7901 Long term (current) use of anticoagulants: Secondary | ICD-10-CM | POA: Diagnosis not present

## 2024-08-11 DIAGNOSIS — Z1211 Encounter for screening for malignant neoplasm of colon: Secondary | ICD-10-CM | POA: Diagnosis present

## 2024-08-11 DIAGNOSIS — I10 Essential (primary) hypertension: Secondary | ICD-10-CM | POA: Insufficient documentation

## 2024-08-11 DIAGNOSIS — F17290 Nicotine dependence, other tobacco product, uncomplicated: Secondary | ICD-10-CM | POA: Insufficient documentation

## 2024-08-11 DIAGNOSIS — Z8249 Family history of ischemic heart disease and other diseases of the circulatory system: Secondary | ICD-10-CM | POA: Diagnosis not present

## 2024-08-11 DIAGNOSIS — I4891 Unspecified atrial fibrillation: Secondary | ICD-10-CM | POA: Insufficient documentation

## 2024-08-11 DIAGNOSIS — D120 Benign neoplasm of cecum: Secondary | ICD-10-CM | POA: Insufficient documentation

## 2024-08-11 MED ORDER — PROPOFOL 10 MG/ML IV BOLUS
INTRAVENOUS | Status: DC | PRN
  Administered 2024-08-11: 100 mg via INTRAVENOUS

## 2024-08-11 MED ORDER — PROPOFOL 500 MG/50ML IV EMUL
INTRAVENOUS | Status: DC | PRN
  Administered 2024-08-11: 150 ug/kg/min via INTRAVENOUS

## 2024-08-11 MED ORDER — LACTATED RINGERS IV SOLN: INTRAVENOUS | Status: DC

## 2024-08-11 MED ORDER — PHENYLEPHRINE 80 MCG/ML (10ML) SYRINGE FOR IV PUSH (FOR BLOOD PRESSURE SUPPORT)
PREFILLED_SYRINGE | INTRAVENOUS | Status: DC | PRN
  Administered 2024-08-11: 160 ug via INTRAVENOUS

## 2024-08-11 NOTE — Transfer of Care (Signed)
 Immediate Anesthesia Transfer of Care Note  Patient: Thomas Baldwin  Procedure(s) Performed: COLONOSCOPY POLYPECTOMY, INTESTINE  Patient Location: Short Stay  Anesthesia Type:MAC  Level of Consciousness: awake, alert , oriented, and patient cooperative  Airway & Oxygen Therapy: Patient Spontanous Breathing  Post-op Assessment: Report given to RN, Post -op Vital signs reviewed and stable, and Patient moving all extremities X 4  Post vital signs: Reviewed and stable  Last Vitals:  Vitals Value Taken Time  BP    Temp 36.6 C 08/11/24 10:57  Pulse 81 08/11/24 10:57  Resp 13 08/11/24 10:57  SpO2 93 % 08/11/24 10:57    Last Pain:  Vitals:   08/11/24 1057  TempSrc: Axillary  PainSc:          Complications: No notable events documented.

## 2024-08-11 NOTE — Anesthesia Postprocedure Evaluation (Signed)
"   Anesthesia Post Note  Patient: Thomas Baldwin  Procedure(s) Performed: COLONOSCOPY POLYPECTOMY, INTESTINE  Patient location during evaluation: Phase II Anesthesia Type: MAC Level of consciousness: awake Pain management: pain level controlled Vital Signs Assessment: post-procedure vital signs reviewed and stable Respiratory status: spontaneous breathing and respiratory function stable Cardiovascular status: blood pressure returned to baseline and stable Postop Assessment: no headache and no apparent nausea or vomiting Anesthetic complications: no Comments: Late entry   No notable events documented.   Last Vitals:  Vitals:   08/11/24 0910 08/11/24 1057  BP: 138/86   Pulse:  81  Resp: 14 13  Temp: (!) 36.2 C 36.6 C  SpO2: 95% 93%    Last Pain:  Vitals:   08/11/24 1057  TempSrc: Axillary  PainSc:                  Yvonna PARAS Lovey Crupi      "

## 2024-08-11 NOTE — H&P (Signed)
 @LOGO @   Primary Care Physician:  Bevely Doffing, FNP Primary Gastroenterologist:  Dr.   Pre-Procedure History & Physical: HPI:  Thomas Baldwin is a 69 y.o. male is here for a screening colonoscopy.  Negative colonoscopy 2008.    On Eliquis  for atrial fibrillation.  Held for this procedure.  Past Medical History:  Diagnosis Date   Atrial fibrillation (HCC)    Atypical chest pain    GERD (gastroesophageal reflux disease)    Questionable   Hematuria none since 07-22-2019   Hypertension    Nerve damage    left hand   Panic attacks     Past Surgical History:  Procedure Laterality Date   colonscopy  2010   CYSTOSCOPY WITH RETROGRADE PYELOGRAM, URETEROSCOPY AND STENT PLACEMENT Bilateral 08/04/2019   Procedure: CYSTOSCOPY WITH BILATERAL  RETROGRADE PYELOGRAM, LEFT DIAGNOSTIC URETEROSCOPY  AND POSSIBLE BIOPSY AND STENT PLACEMENT;  Surgeon: Cam Morene ORN, MD;  Location: [facility];  Service: Urology;  Laterality: Bilateral;   LAPAROSCOPIC NEPHRECTOMY, HAND ASSISTED Left 08/24/2019   Procedure: LAPAROSCOPIC LEFT NEPHRECTOMY;  Surgeon: Cam Morene ORN, MD;  Location: [facility];  Service: Urology;  Laterality: Left;   NOSE SURGERY  1980   TONSILECTOMY, ADENOIDECTOMY, BILATERAL MYRINGOTOMY AND TUBES      Prior to Admission medications  Medication Sig Start Date End Date Taking? Authorizing Provider  amLODipine  (NORVASC ) 5 MG tablet Take 1 tablet (5 mg total) by mouth daily. 07/18/24  Yes Theophilus Andrews, Tully GRADE, MD  losartan  (COZAAR ) 100 MG tablet Take 1 tablet (100 mg total) by mouth daily. 07/18/24  Yes Theophilus Andrews, Tully GRADE, MD  metoprolol  tartrate (LOPRESSOR ) 50 MG tablet Take 1.5 tablets (75 mg total) by mouth 2 (two) times daily. TAKE 1 TABLET BY MOUTH TWICE  DAILY 07/25/24  Yes Miriam Norris, NP  omeprazole  (PRILOSEC) 20 MG capsule Take 1 capsule (20 mg total) by mouth daily. 07/18/24  Yes Theophilus Andrews, Tully GRADE, MD  traZODone  (DESYREL ) 150 MG tablet TAKE 1  TABLET BY MOUTH AT  BEDTIME 01/13/24  Yes Theophilus Andrews, Tully GRADE, MD  apixaban  (ELIQUIS ) 5 MG TABS tablet Take 1 tablet (5 mg total) by mouth 2 (two) times daily. 07/17/24   Alvan Dorn FALCON, MD  Blood Pressure Monitoring (OMRON 3 SERIES BP MONITOR) DEVI Use as directed 06/14/24   Alvan Dorn FALCON, MD    Allergies as of 06/23/2024   (No Known Allergies)    Family History  Problem Relation Age of Onset   Coronary artery disease Other    Diabetes Other    Hypertension Other    Colon cancer Neg Hx     Social History   Socioeconomic History   Marital status: Married    Spouse name: Not on file   Number of children: Not on file   Years of education: Not on file   Highest education level: Associate degree: occupational, scientist, product/process development, or vocational program  Occupational History   Not on file  Tobacco Use   Smoking status: Some Days    Types: Cigars   Smokeless tobacco: Never   Tobacco comments:    occ  Vaping Use   Vaping status: Never Used  Substance and Sexual Activity   Alcohol use: Yes    Alcohol/week: 21.0 standard drinks of alcohol    Types: 21 Cans of beer per week    Comment: approx 3 beers/day, some days none   Drug use: Not Currently   Sexual activity: Not on file  Other Topics  Concern   Not on file  Social History Narrative   Not on file   Social Drivers of Health   Tobacco Use: High Risk (08/11/2024)   Patient History    Smoking Tobacco Use: Some Days    Smokeless Tobacco Use: Never    Passive Exposure: Not on file  Financial Resource Strain: Low Risk (01/31/2024)   Overall Financial Resource Strain (CARDIA)    Difficulty of Paying Living Expenses: Not hard at all  Food Insecurity: No Food Insecurity (01/31/2024)   Epic    Worried About Radiation Protection Practitioner of Food in the Last Year: Never true    Ran Out of Food in the Last Year: Never true  Transportation Needs: No Transportation Needs (01/31/2024)   Epic    Lack of Transportation (Medical): No    Lack of  Transportation (Non-Medical): No  Physical Activity: Inactive (01/31/2024)   Exercise Vital Sign    Days of Exercise per Week: 0 days    Minutes of Exercise per Session: Not on file  Stress: Stress Concern Present (01/31/2024)   [employer] of Occupational Health - Occupational Stress Questionnaire    Feeling of Stress: Very much  Social Connections: Moderately Isolated (01/31/2024)   Social Connection and Isolation Panel    Frequency of Communication with Friends and Family: Three times a week    Frequency of Social Gatherings with Friends and Family: Once a week    Attends Religious Services: Never    Database Administrator or Organizations: No    Attends Engineer, Structural: Not on file    Marital Status: Married  Catering Manager Violence: Not At Risk (12/17/2023)   Humiliation, Afraid, Rape, and Kick questionnaire    Fear of Current or Ex-Partner: No    Emotionally Abused: No    Physically Abused: No    Sexually Abused: No  Depression (PHQ2-9): Low Risk (12/17/2023)   Depression (PHQ2-9)    PHQ-2 Score: 0  Recent Concern: Depression (PHQ2-9) - Medium Risk (12/16/2023)   Depression (PHQ2-9)    PHQ-2 Score: 9  Alcohol Screen: Low Risk (01/31/2024)   Alcohol Screen    Last Alcohol Screening Score (AUDIT): 4  Housing: Low Risk (01/31/2024)   Epic    Unable to Pay for Housing in the Last Year: No    Number of Times Moved in the Last Year: 0    Homeless in the Last Year: No  Utilities: Not At Risk (12/17/2023)   [employer] Utilities    Threatened with loss of utilities: No  Health Literacy: Adequate Health Literacy (12/17/2023)   B1300 Health Literacy    Frequency of need for help with medical instructions: Never    Review of Systems: See HPI, otherwise negative ROS  Physical Exam: BP 138/86 (BP Location: Right Arm)   Temp (!) 97.1 F (36.2 C)   Resp 14   SpO2 95%  General:   Alert,  Well-developed, well-nourished, pleasant and cooperative in NAD Lungs:  Clear  throughout to auscultation.   No wheezes, crackles, or rhonchi. No acute distress. Heart:  Regular rate and rhythm; no murmurs, clicks, rubs,  or gallops. Abdomen:  Soft, nontender and nondistended. No masses, hepatosplenomegaly or hernias noted. Normal bowel sounds, without guarding, and without rebound.   Impression/Plan: Thomas Baldwin is now here to undergo a screening colonoscopy.    Average risk screening  Risks, benefits, limitations, imponderables and alternatives regarding colonoscopy have been reviewed with the patient. Questions have been answered. All parties agreeable.  Notice:  This dictation was prepared with Dragon dictation along with smaller phrase technology. Any transcriptional errors that result from this process are unintentional and may not be corrected upon review.

## 2024-08-11 NOTE — Discharge Instructions (Addendum)
" °  Colonoscopy Discharge Instructions  Read the instructions outlined below and refer to this sheet in the next few weeks. These discharge instructions provide you with general information on caring for yourself after you leave the hospital. Your doctor may also give you specific instructions. While your treatment has been planned according to the most current medical practices available, unavoidable complications occasionally occur. If you have any problems or questions after discharge, call Dr. Shaaron at [phone]. ACTIVITY You may resume your regular activity, but move at a slower pace for the next 24 hours.  Take frequent rest periods for the next 24 hours.  Walking will help get rid of the air and reduce the bloated feeling in your belly (abdomen).  No driving for 24 hours (because of the medicine (anesthesia) used during the test).   Do not sign any important legal documents or operate any machinery for 24 hours (because of the anesthesia used during the test).  NUTRITION Drink plenty of fluids.  You may resume your normal diet as instructed by your doctor.  Begin with a light meal and progress to your normal diet. Heavy or fried foods are harder to digest and may make you feel sick to your stomach (nauseated).  Avoid alcoholic beverages for 24 hours or as instructed.  MEDICATIONS You may resume your normal medications unless your doctor tells you otherwise.  WHAT YOU CAN EXPECT TODAY Some feelings of bloating in the abdomen.  Passage of more gas than usual.  Spotting of blood in your stool or on the toilet paper.  IF YOU HAD POLYPS REMOVED DURING THE COLONOSCOPY: No aspirin products for 7 days or as instructed.  No alcohol for 7 days or as instructed.  Eat a soft diet for the next 24 hours.  FINDING OUT THE RESULTS OF YOUR TEST Not all test results are available during your visit. If your test results are not back during the visit, make an appointment with your caregiver to find out the  results. Do not assume everything is normal if you have not heard from your caregiver or the medical facility. It is important for you to follow up on all of your test results.  SEEK IMMEDIATE MEDICAL ATTENTION IF: You have more than a spotting of blood in your stool.  Your belly is swollen (abdominal distention).  You are nauseated or vomiting.  You have a temperature over 101.  You have abdominal pain or discomfort that is severe or gets worse throughout the day.      4 polyps found and removed  Further recommendations to follow pending review of pathology report  Please wait until tomorrow to resume your Eliquis    at patient request, I called Nena Litten,   [phone] findings and recommendations "

## 2024-08-11 NOTE — Anesthesia Preprocedure Evaluation (Signed)
"                                    Anesthesia Evaluation  Patient identified by MRN, date of birth, ID band Patient awake    Reviewed: Allergy & Precautions, H&P , NPO status , Patient's Chart, lab work & pertinent test results, reviewed documented beta blocker date and time   Airway Mallampati: II  TM Distance: >3 FB Neck ROM: full    Dental no notable dental hx.    Pulmonary neg pulmonary ROS, Current Smoker and Patient abstained from smoking.   Pulmonary exam normal breath sounds clear to auscultation       Cardiovascular Exercise Tolerance: Good hypertension,  Rhythm:regular Rate:Normal     Neuro/Psych   Anxiety     negative neurological ROS  negative psych ROS   GI/Hepatic Neg liver ROS,GERD  ,,  Endo/Other  negative endocrine ROS    Renal/GU negative Renal ROS  negative genitourinary   Musculoskeletal   Abdominal   Peds  Hematology negative hematology ROS (+)   Anesthesia Other Findings   Reproductive/Obstetrics negative OB ROS                              Anesthesia Physical Anesthesia Plan  ASA: 3  Anesthesia Plan: MAC   Post-op Pain Management:    Induction:   PONV Risk Score and Plan: Propofol  infusion  Airway Management Planned:   Additional Equipment:   Intra-op Plan:   Post-operative Plan:   Informed Consent: I have reviewed the patients History and Physical, chart, labs and discussed the procedure including the risks, benefits and alternatives for the proposed anesthesia with the patient or authorized representative who has indicated his/her understanding and acceptance.     Dental Advisory Given  Plan Discussed with: CRNA  Anesthesia Plan Comments:         Anesthesia Quick Evaluation  "

## 2024-08-12 ENCOUNTER — Encounter (HOSPITAL_COMMUNITY): Payer: Self-pay | Admitting: Internal Medicine

## 2024-08-14 LAB — SURGICAL PATHOLOGY

## 2024-08-15 ENCOUNTER — Ambulatory Visit: Payer: Self-pay | Admitting: Internal Medicine

## 2024-08-16 ENCOUNTER — Ambulatory Visit: Payer: Self-pay | Admitting: Nurse Practitioner

## 2024-08-23 ENCOUNTER — Ambulatory Visit

## 2024-10-24 ENCOUNTER — Ambulatory Visit: Admitting: Nurse Practitioner

## 2024-12-22 ENCOUNTER — Ambulatory Visit
